# Patient Record
Sex: Male | Born: 1945 | Race: White | Hispanic: No | Smoking: Former smoker
Health system: Southern US, Community
[De-identification: ages and names within clinical notes are randomized; demographics above are authoritative.]

## PROBLEM LIST (undated history)

## (undated) DIAGNOSIS — K7460 Unspecified cirrhosis of liver: Secondary | ICD-10-CM

## (undated) DIAGNOSIS — I10 Essential (primary) hypertension: Secondary | ICD-10-CM

## (undated) DIAGNOSIS — C4490 Unspecified malignant neoplasm of skin, unspecified: Secondary | ICD-10-CM

## (undated) DIAGNOSIS — E119 Type 2 diabetes mellitus without complications: Secondary | ICD-10-CM

## (undated) DIAGNOSIS — I2510 Atherosclerotic heart disease of native coronary artery without angina pectoris: Secondary | ICD-10-CM

## (undated) DIAGNOSIS — Z5189 Encounter for other specified aftercare: Secondary | ICD-10-CM

## (undated) HISTORY — PX: CORONARY ARTERY BYPASS GRAFT: SHX141

## (undated) HISTORY — PX: SKIN CANCER EXCISION: SHX779

---

## 2011-02-17 DEATH — deceased

## 2015-09-13 ENCOUNTER — Inpatient Hospital Stay (HOSPITAL_COMMUNITY): Payer: Medicare Other | Admitting: Certified Registered Nurse Anesthetist

## 2015-09-13 ENCOUNTER — Emergency Department (HOSPITAL_BASED_OUTPATIENT_CLINIC_OR_DEPARTMENT_OTHER): Payer: Medicare Other

## 2015-09-13 ENCOUNTER — Encounter (HOSPITAL_COMMUNITY)
Admission: EM | Disposition: A | Discharge status: Skilled Nursing Facility | Payer: Self-pay | Source: Home / Self Care | Attending: Neurological Surgery

## 2015-09-13 ENCOUNTER — Inpatient Hospital Stay (HOSPITAL_BASED_OUTPATIENT_CLINIC_OR_DEPARTMENT_OTHER)
Admission: EM | Admit: 2015-09-13 | Discharge: 2015-09-28 | DRG: 025 | Disposition: A | Discharge status: Skilled Nursing Facility | Payer: Medicare Other | Attending: Neurological Surgery | Admitting: Neurological Surgery

## 2015-09-13 ENCOUNTER — Encounter (HOSPITAL_BASED_OUTPATIENT_CLINIC_OR_DEPARTMENT_OTHER): Payer: Self-pay

## 2015-09-13 DIAGNOSIS — D696 Thrombocytopenia, unspecified: Secondary | ICD-10-CM

## 2015-09-13 DIAGNOSIS — K760 Fatty (change of) liver, not elsewhere classified: Secondary | ICD-10-CM | POA: Diagnosis present

## 2015-09-13 DIAGNOSIS — G8191 Hemiplegia, unspecified affecting right dominant side: Secondary | ICD-10-CM | POA: Diagnosis not present

## 2015-09-13 DIAGNOSIS — Z01818 Encounter for other preprocedural examination: Secondary | ICD-10-CM | POA: Diagnosis not present

## 2015-09-13 DIAGNOSIS — I129 Hypertensive chronic kidney disease with stage 1 through stage 4 chronic kidney disease, or unspecified chronic kidney disease: Secondary | ICD-10-CM | POA: Diagnosis present

## 2015-09-13 DIAGNOSIS — S065XAA Traumatic subdural hemorrhage with loss of consciousness status unknown, initial encounter: Secondary | ICD-10-CM | POA: Diagnosis present

## 2015-09-13 DIAGNOSIS — D72819 Decreased white blood cell count, unspecified: Secondary | ICD-10-CM | POA: Insufficient documentation

## 2015-09-13 DIAGNOSIS — I6200 Nontraumatic subdural hemorrhage, unspecified: Secondary | ICD-10-CM | POA: Diagnosis not present

## 2015-09-13 DIAGNOSIS — R402362 Coma scale, best motor response, obeys commands, at arrival to emergency department: Secondary | ICD-10-CM | POA: Diagnosis present

## 2015-09-13 DIAGNOSIS — S065X9A Traumatic subdural hemorrhage with loss of consciousness of unspecified duration, initial encounter: Secondary | ICD-10-CM | POA: Diagnosis present

## 2015-09-13 DIAGNOSIS — E118 Type 2 diabetes mellitus with unspecified complications: Secondary | ICD-10-CM | POA: Diagnosis not present

## 2015-09-13 DIAGNOSIS — R402142 Coma scale, eyes open, spontaneous, at arrival to emergency department: Secondary | ICD-10-CM | POA: Diagnosis present

## 2015-09-13 DIAGNOSIS — R402252 Coma scale, best verbal response, oriented, at arrival to emergency department: Secondary | ICD-10-CM | POA: Diagnosis present

## 2015-09-13 DIAGNOSIS — Z9289 Personal history of other medical treatment: Secondary | ICD-10-CM

## 2015-09-13 DIAGNOSIS — D649 Anemia, unspecified: Secondary | ICD-10-CM | POA: Diagnosis present

## 2015-09-13 DIAGNOSIS — G9340 Encephalopathy, unspecified: Secondary | ICD-10-CM | POA: Diagnosis present

## 2015-09-13 DIAGNOSIS — I708 Atherosclerosis of other arteries: Secondary | ICD-10-CM | POA: Diagnosis present

## 2015-09-13 DIAGNOSIS — I6523 Occlusion and stenosis of bilateral carotid arteries: Secondary | ICD-10-CM | POA: Diagnosis present

## 2015-09-13 DIAGNOSIS — I6789 Other cerebrovascular disease: Secondary | ICD-10-CM | POA: Diagnosis not present

## 2015-09-13 DIAGNOSIS — I2510 Atherosclerotic heart disease of native coronary artery without angina pectoris: Secondary | ICD-10-CM | POA: Diagnosis present

## 2015-09-13 DIAGNOSIS — R161 Splenomegaly, not elsewhere classified: Secondary | ICD-10-CM | POA: Diagnosis present

## 2015-09-13 DIAGNOSIS — S069X9S Unspecified intracranial injury with loss of consciousness of unspecified duration, sequela: Secondary | ICD-10-CM

## 2015-09-13 DIAGNOSIS — G252 Other specified forms of tremor: Secondary | ICD-10-CM | POA: Diagnosis present

## 2015-09-13 DIAGNOSIS — R4701 Aphasia: Secondary | ICD-10-CM | POA: Diagnosis not present

## 2015-09-13 DIAGNOSIS — J9690 Respiratory failure, unspecified, unspecified whether with hypoxia or hypercapnia: Secondary | ICD-10-CM | POA: Insufficient documentation

## 2015-09-13 DIAGNOSIS — K7460 Unspecified cirrhosis of liver: Secondary | ICD-10-CM

## 2015-09-13 DIAGNOSIS — E1122 Type 2 diabetes mellitus with diabetic chronic kidney disease: Secondary | ICD-10-CM | POA: Diagnosis present

## 2015-09-13 DIAGNOSIS — R569 Unspecified convulsions: Secondary | ICD-10-CM | POA: Diagnosis not present

## 2015-09-13 DIAGNOSIS — G20 Parkinson's disease: Secondary | ICD-10-CM | POA: Insufficient documentation

## 2015-09-13 DIAGNOSIS — H02402 Unspecified ptosis of left eyelid: Secondary | ICD-10-CM | POA: Diagnosis not present

## 2015-09-13 DIAGNOSIS — Z87891 Personal history of nicotine dependence: Secondary | ICD-10-CM | POA: Diagnosis not present

## 2015-09-13 DIAGNOSIS — R482 Apraxia: Secondary | ICD-10-CM | POA: Diagnosis not present

## 2015-09-13 DIAGNOSIS — I10 Essential (primary) hypertension: Secondary | ICD-10-CM | POA: Insufficient documentation

## 2015-09-13 DIAGNOSIS — E875 Hyperkalemia: Secondary | ICD-10-CM | POA: Diagnosis not present

## 2015-09-13 DIAGNOSIS — D6959 Other secondary thrombocytopenia: Secondary | ICD-10-CM | POA: Diagnosis present

## 2015-09-13 DIAGNOSIS — N189 Chronic kidney disease, unspecified: Secondary | ICD-10-CM | POA: Diagnosis present

## 2015-09-13 DIAGNOSIS — Z7984 Long term (current) use of oral hypoglycemic drugs: Secondary | ICD-10-CM

## 2015-09-13 DIAGNOSIS — Z951 Presence of aortocoronary bypass graft: Secondary | ICD-10-CM | POA: Diagnosis not present

## 2015-09-13 DIAGNOSIS — I639 Cerebral infarction, unspecified: Secondary | ICD-10-CM

## 2015-09-13 DIAGNOSIS — R471 Dysarthria and anarthria: Secondary | ICD-10-CM | POA: Diagnosis not present

## 2015-09-13 DIAGNOSIS — E871 Hypo-osmolality and hyponatremia: Secondary | ICD-10-CM | POA: Diagnosis not present

## 2015-09-13 DIAGNOSIS — K7030 Alcoholic cirrhosis of liver without ascites: Secondary | ICD-10-CM | POA: Diagnosis not present

## 2015-09-13 DIAGNOSIS — G40901 Epilepsy, unspecified, not intractable, with status epilepticus: Secondary | ICD-10-CM | POA: Diagnosis not present

## 2015-09-13 DIAGNOSIS — R42 Dizziness and giddiness: Secondary | ICD-10-CM | POA: Diagnosis present

## 2015-09-13 DIAGNOSIS — D61818 Other pancytopenia: Secondary | ICD-10-CM | POA: Diagnosis present

## 2015-09-13 DIAGNOSIS — J9600 Acute respiratory failure, unspecified whether with hypoxia or hypercapnia: Secondary | ICD-10-CM | POA: Diagnosis not present

## 2015-09-13 DIAGNOSIS — G936 Cerebral edema: Secondary | ICD-10-CM | POA: Diagnosis not present

## 2015-09-13 DIAGNOSIS — S069XAA Unspecified intracranial injury with loss of consciousness status unknown, initial encounter: Secondary | ICD-10-CM | POA: Insufficient documentation

## 2015-09-13 DIAGNOSIS — Z4659 Encounter for fitting and adjustment of other gastrointestinal appliance and device: Secondary | ICD-10-CM

## 2015-09-13 DIAGNOSIS — S069X9A Unspecified intracranial injury with loss of consciousness of unspecified duration, initial encounter: Secondary | ICD-10-CM | POA: Insufficient documentation

## 2015-09-13 DIAGNOSIS — J9601 Acute respiratory failure with hypoxia: Secondary | ICD-10-CM | POA: Diagnosis not present

## 2015-09-13 HISTORY — DX: Atherosclerotic heart disease of native coronary artery without angina pectoris: I25.10

## 2015-09-13 HISTORY — DX: Encounter for other specified aftercare: Z51.89

## 2015-09-13 HISTORY — DX: Unspecified cirrhosis of liver: K74.60

## 2015-09-13 HISTORY — DX: Unspecified malignant neoplasm of skin, unspecified: C44.90

## 2015-09-13 HISTORY — DX: Essential (primary) hypertension: I10

## 2015-09-13 HISTORY — PX: CRANIOTOMY: SHX93

## 2015-09-13 HISTORY — DX: Type 2 diabetes mellitus without complications: E11.9

## 2015-09-13 LAB — BASIC METABOLIC PANEL
Anion gap: 6 (ref 5–15)
BUN: 24 mg/dL — AB (ref 6–20)
CO2: 25 mmol/L (ref 22–32)
CREATININE: 1.74 mg/dL — AB (ref 0.61–1.24)
Calcium: 9 mg/dL (ref 8.9–10.3)
Chloride: 106 mmol/L (ref 101–111)
GFR calc Af Amer: 44 mL/min — ABNORMAL LOW (ref >60)
GFR, EST NON AFRICAN AMERICAN: 38 mL/min — AB (ref >60)
GLUCOSE: 229 mg/dL — AB (ref 65–99)
POTASSIUM: 4.8 mmol/L (ref 3.5–5.1)
SODIUM: 137 mmol/L (ref 135–145)

## 2015-09-13 LAB — CBC WITH DIFFERENTIAL/PLATELET
Basophils Absolute: 0 10*3/uL (ref 0.0–0.1)
Basophils Relative: 1 %
EOS ABS: 0 10*3/uL (ref 0.0–0.7)
EOS PCT: 1 %
HCT: 36.5 % — ABNORMAL LOW (ref 39.0–52.0)
Hemoglobin: 12.3 g/dL — ABNORMAL LOW (ref 13.0–17.0)
LYMPHS ABS: 0.8 10*3/uL (ref 0.7–4.0)
LYMPHS PCT: 24 %
MCH: 33.4 pg (ref 26.0–34.0)
MCHC: 33.7 g/dL (ref 30.0–36.0)
MCV: 99.2 fL (ref 78.0–100.0)
MONO ABS: 0.3 10*3/uL (ref 0.1–1.0)
MONOS PCT: 8 %
Neutro Abs: 2.3 10*3/uL (ref 1.7–7.7)
Neutrophils Relative %: 67 %
PLATELETS: 37 10*3/uL — AB (ref 150–400)
RBC: 3.68 MIL/uL — AB (ref 4.22–5.81)
RDW: 11.9 % (ref 11.5–15.5)
WBC: 3.5 10*3/uL — ABNORMAL LOW (ref 4.0–10.5)

## 2015-09-13 LAB — MRSA PCR SCREENING: MRSA BY PCR: NEGATIVE

## 2015-09-13 LAB — PROTIME-INR
INR: 1.19 (ref 0.00–1.49)
PROTHROMBIN TIME: 15.3 s — AB (ref 11.6–15.2)

## 2015-09-13 LAB — CBG MONITORING, ED
Glucose-Capillary: 199 mg/dL — ABNORMAL HIGH (ref 65–99)
Glucose-Capillary: 190 mg/dL — ABNORMAL HIGH (ref 65–99)

## 2015-09-13 LAB — TROPONIN I: Troponin I: <0.03 ng/mL (ref <0.031)

## 2015-09-13 LAB — URINALYSIS, ROUTINE W REFLEX MICROSCOPIC
BILIRUBIN URINE: NEGATIVE
GLUCOSE, UA: 100 mg/dL — AB
HGB URINE DIPSTICK: NEGATIVE
Ketones, ur: NEGATIVE mg/dL
Leukocytes, UA: NEGATIVE
Nitrite: NEGATIVE
PROTEIN: NEGATIVE mg/dL
SPECIFIC GRAVITY, URINE: 1.018 (ref 1.005–1.030)
pH: 6 (ref 5.0–8.0)

## 2015-09-13 LAB — APTT: aPTT: 29 seconds (ref 24–37)

## 2015-09-13 SURGERY — CRANIOTOMY HEMATOMA EVACUATION SUBDURAL
Anesthesia: General | Site: Head | Laterality: Left

## 2015-09-13 MED ORDER — POTASSIUM CHLORIDE IN NACL 20-0.9 MEQ/L-% IV SOLN
INTRAVENOUS | Status: DC
Start: 2015-09-13 — End: 2015-09-18
  Administered 2015-09-13 – 2015-09-15 (×3): via INTRAVENOUS
  Administered 2015-09-15: 75 mL/h via INTRAVENOUS
  Administered 2015-09-16 – 2015-09-17 (×4): via INTRAVENOUS
  Filled 2015-09-13 (×12): qty 1000

## 2015-09-13 MED ORDER — FERROUS SULFATE 325 (65 FE) MG PO TABS
325.0000 mg | ORAL_TABLET | Freq: Every day | ORAL | Status: DC
  Administered 2015-09-14 – 2015-09-28 (×14): 325 mg via ORAL
  Filled 2015-09-13 (×14): qty 1

## 2015-09-13 MED ORDER — SODIUM CHLORIDE 0.9 % IV SOLN
500.0000 mg | Freq: Two times a day (BID) | INTRAVENOUS | Status: DC
  Administered 2015-09-13 – 2015-09-15 (×4): 500 mg via INTRAVENOUS
  Filled 2015-09-13 (×5): qty 5

## 2015-09-13 MED ORDER — DOCUSATE SODIUM 100 MG PO CAPS
100.0000 mg | ORAL_CAPSULE | Freq: Two times a day (BID) | ORAL | Status: DC
  Administered 2015-09-13 – 2015-09-17 (×7): 100 mg via ORAL
  Filled 2015-09-13 (×7): qty 1

## 2015-09-13 MED ORDER — THROMBIN 20000 UNITS EX SOLR
CUTANEOUS | Status: DC | PRN
  Administered 2015-09-13: 18:00:00 via TOPICAL

## 2015-09-13 MED ORDER — ONDANSETRON HCL 4 MG/2ML IJ SOLN
4.0000 mg | INTRAMUSCULAR | Status: DC | PRN
  Administered 2015-09-18: 4 mg via INTRAVENOUS

## 2015-09-13 MED ORDER — GLIPIZIDE 10 MG PO TABS
10.0000 mg | ORAL_TABLET | Freq: Two times a day (BID) | ORAL | Status: DC
  Administered 2015-09-14 – 2015-09-18 (×8): 10 mg via ORAL
  Filled 2015-09-13 (×10): qty 1

## 2015-09-13 MED ORDER — LABETALOL HCL 5 MG/ML IV SOLN
10.0000 mg | INTRAVENOUS | Status: DC | PRN
  Filled 2015-09-13: qty 4

## 2015-09-13 MED ORDER — EPHEDRINE SULFATE 50 MG/ML IJ SOLN
INTRAMUSCULAR | Status: DC | PRN
  Administered 2015-09-13: 10 mg via INTRAVENOUS

## 2015-09-13 MED ORDER — 0.9 % SODIUM CHLORIDE (POUR BTL) OPTIME
TOPICAL | Status: DC | PRN
  Administered 2015-09-13 (×3): 1000 mL

## 2015-09-13 MED ORDER — CEFAZOLIN SODIUM-DEXTROSE 2-3 GM-% IV SOLR
INTRAVENOUS | Status: DC | PRN
  Administered 2015-09-13: 2 g via INTRAVENOUS

## 2015-09-13 MED ORDER — PANTOPRAZOLE SODIUM 40 MG PO TBEC: 40.0000 mg | DELAYED_RELEASE_TABLET | Freq: Every day | ORAL | Status: DC

## 2015-09-13 MED ORDER — PROPOFOL 10 MG/ML IV BOLUS
INTRAVENOUS | Status: DC | PRN
  Administered 2015-09-13: 120 mg via INTRAVENOUS

## 2015-09-13 MED ORDER — THROMBIN 5000 UNITS EX SOLR
OROMUCOSAL | Status: DC | PRN
  Administered 2015-09-13: 18:00:00 via TOPICAL

## 2015-09-13 MED ORDER — LISINOPRIL 20 MG PO TABS
20.0000 mg | ORAL_TABLET | Freq: Every day | ORAL | Status: DC
  Administered 2015-09-14 – 2015-09-17 (×4): 20 mg via ORAL
  Filled 2015-09-13 (×4): qty 1

## 2015-09-13 MED ORDER — THIAMINE HCL 100 MG/ML IJ SOLN
Freq: Once | INTRAVENOUS | Status: AC
  Administered 2015-09-13: 19:00:00 via INTRAVENOUS
  Filled 2015-09-13: qty 1000

## 2015-09-13 MED ORDER — SODIUM CHLORIDE 0.9 % IR SOLN
Status: DC | PRN
  Administered 2015-09-13: 18:00:00

## 2015-09-13 MED ORDER — BACITRACIN ZINC 500 UNIT/GM EX OINT
TOPICAL_OINTMENT | CUTANEOUS | Status: DC | PRN
  Administered 2015-09-13: 1 via TOPICAL

## 2015-09-13 MED ORDER — HYDROCODONE-ACETAMINOPHEN 5-325 MG PO TABS: 1.0000 | ORAL_TABLET | ORAL | Status: DC | PRN

## 2015-09-13 MED ORDER — SODIUM CHLORIDE 0.9 % IV SOLN
INTRAVENOUS | Status: DC | PRN
  Administered 2015-09-13: 16:00:00 via INTRAVENOUS

## 2015-09-13 MED ORDER — ONDANSETRON HCL 4 MG/2ML IJ SOLN
INTRAMUSCULAR | Status: DC | PRN
  Administered 2015-09-13: 4 mg via INTRAVENOUS

## 2015-09-13 MED ORDER — SODIUM CHLORIDE 0.9 % IV SOLN
10.0000 mg | INTRAVENOUS | Status: DC | PRN
  Administered 2015-09-13: 10 ug/min via INTRAVENOUS

## 2015-09-13 MED ORDER — MIDAZOLAM HCL 5 MG/5ML IJ SOLN
INTRAMUSCULAR | Status: DC | PRN
  Administered 2015-09-13: 2 mg via INTRAVENOUS

## 2015-09-13 MED ORDER — ONDANSETRON HCL 4 MG PO TABS: 4.0000 mg | ORAL_TABLET | ORAL | Status: DC | PRN

## 2015-09-13 MED ORDER — LIDOCAINE HCL (CARDIAC) 20 MG/ML IV SOLN
INTRAVENOUS | Status: DC | PRN
  Administered 2015-09-13: 100 mg via INTRAVENOUS

## 2015-09-13 MED ORDER — ATENOLOL 25 MG PO TABS
25.0000 mg | ORAL_TABLET | Freq: Every day | ORAL | Status: DC
  Administered 2015-09-14 – 2015-09-28 (×14): 25 mg via ORAL
  Filled 2015-09-13 (×14): qty 1

## 2015-09-13 MED ORDER — ROCURONIUM BROMIDE 100 MG/10ML IV SOLN
INTRAVENOUS | Status: DC | PRN
  Administered 2015-09-13: 30 mg via INTRAVENOUS

## 2015-09-13 MED ORDER — ACETAMINOPHEN 650 MG RE SUPP: 650.0000 mg | RECTAL | Status: DC | PRN

## 2015-09-13 MED ORDER — PROMETHAZINE HCL 12.5 MG PO TABS
12.5000 mg | ORAL_TABLET | ORAL | Status: DC | PRN
  Filled 2015-09-13: qty 2

## 2015-09-13 MED ORDER — FENTANYL CITRATE (PF) 250 MCG/5ML IJ SOLN
INTRAMUSCULAR | Status: DC | PRN
  Administered 2015-09-13: 150 ug via INTRAVENOUS
  Administered 2015-09-13: 100 ug via INTRAVENOUS

## 2015-09-13 MED ORDER — CEFAZOLIN SODIUM-DEXTROSE 2-3 GM-% IV SOLR
2.0000 g | Freq: Three times a day (TID) | INTRAVENOUS | Status: AC
  Administered 2015-09-14 (×2): 2 g via INTRAVENOUS
  Filled 2015-09-13 (×3): qty 50

## 2015-09-13 MED ORDER — MORPHINE SULFATE (PF) 2 MG/ML IV SOLN: 1.0000 mg | INTRAVENOUS | Status: DC | PRN

## 2015-09-13 MED ORDER — SODIUM CHLORIDE 0.9 % IV BOLUS (SEPSIS): 500.0000 mL | Freq: Once | INTRAVENOUS | Status: DC

## 2015-09-13 MED ORDER — LIDOCAINE-EPINEPHRINE 1 %-1:100000 IJ SOLN
INTRAMUSCULAR | Status: DC | PRN
  Administered 2015-09-13: 10 mL

## 2015-09-13 MED ORDER — SUCCINYLCHOLINE CHLORIDE 20 MG/ML IJ SOLN
INTRAMUSCULAR | Status: DC | PRN
  Administered 2015-09-13: 140 mg via INTRAVENOUS

## 2015-09-13 MED ORDER — PANTOPRAZOLE SODIUM 40 MG IV SOLR
40.0000 mg | Freq: Every day | INTRAVENOUS | Status: DC
  Administered 2015-09-14 (×2): 40 mg via INTRAVENOUS
  Filled 2015-09-13 (×2): qty 40

## 2015-09-13 MED ORDER — GLYCOPYRROLATE 0.2 MG/ML IJ SOLN
INTRAMUSCULAR | Status: DC | PRN
  Administered 2015-09-13: 0.6 mg via INTRAVENOUS

## 2015-09-13 MED ORDER — NEOSTIGMINE METHYLSULFATE 10 MG/10ML IV SOLN
INTRAVENOUS | Status: DC | PRN
  Administered 2015-09-13: 5 mg via INTRAVENOUS

## 2015-09-13 MED ORDER — ACETAMINOPHEN 325 MG PO TABS: 650.0000 mg | ORAL_TABLET | ORAL | Status: DC | PRN

## 2015-09-13 SURGICAL SUPPLY — 55 items
BAG DECANTER FOR FLEXI CONT (MISCELLANEOUS) ×3 IMPLANT
BUR SPIRAL ROUTER 2.3 (BUR) ×2 IMPLANT
BUR SPIRAL ROUTER 2.3MM (BUR) ×1
CANISTER SUCT 3000ML PPV (MISCELLANEOUS) ×3 IMPLANT
CLIP TI MEDIUM 6 (CLIP) IMPLANT
DRAIN CHANNEL 10M FLAT 3/4 FLT (DRAIN) ×3 IMPLANT
DRAPE MICROSCOPE LEICA (MISCELLANEOUS) IMPLANT
DRAPE NEUROLOGICAL W/INCISE (DRAPES) ×3 IMPLANT
DRAPE SURG 17X23 STRL (DRAPES) IMPLANT
DRAPE WARM FLUID 44X44 (DRAPE) ×3 IMPLANT
DURAPREP 6ML APPLICATOR 50/CS (WOUND CARE) ×3 IMPLANT
ELECT CAUTERY BLADE 6.4 (BLADE) ×3 IMPLANT
ELECT REM PT RETURN 9FT ADLT (ELECTROSURGICAL) ×3
ELECTRODE REM PT RTRN 9FT ADLT (ELECTROSURGICAL) ×1 IMPLANT
EVACUATOR 1/8 PVC DRAIN (DRAIN) IMPLANT
EVACUATOR SILICONE 100CC (DRAIN) ×3 IMPLANT
GAUZE SPONGE 4X4 12PLY STRL (GAUZE/BANDAGES/DRESSINGS) ×3 IMPLANT
GAUZE SPONGE 4X4 16PLY XRAY LF (GAUZE/BANDAGES/DRESSINGS) IMPLANT
GLOVE BIO SURGEON STRL SZ8 (GLOVE) ×3 IMPLANT
GOWN STRL REUS W/ TWL LRG LVL3 (GOWN DISPOSABLE) IMPLANT
GOWN STRL REUS W/ TWL XL LVL3 (GOWN DISPOSABLE) IMPLANT
GOWN STRL REUS W/TWL 2XL LVL3 (GOWN DISPOSABLE) ×3 IMPLANT
GOWN STRL REUS W/TWL LRG LVL3 (GOWN DISPOSABLE)
GOWN STRL REUS W/TWL XL LVL3 (GOWN DISPOSABLE)
HEMOSTAT POWDER KIT SURGIFOAM (HEMOSTASIS) IMPLANT
KIT BASIN OR (CUSTOM PROCEDURE TRAY) ×3 IMPLANT
KIT ROOM TURNOVER OR (KITS) ×3 IMPLANT
NEEDLE HYPO 22GX1.5 SAFETY (NEEDLE) ×3 IMPLANT
NS IRRIG 1000ML POUR BTL (IV SOLUTION) ×3 IMPLANT
PACK CRANIOTOMY (CUSTOM PROCEDURE TRAY) ×3 IMPLANT
PAD ARMBOARD 7.5X6 YLW CONV (MISCELLANEOUS) ×3 IMPLANT
PATTIES SURGICAL .25X.25 (GAUZE/BANDAGES/DRESSINGS) IMPLANT
PATTIES SURGICAL .5 X.5 (GAUZE/BANDAGES/DRESSINGS) IMPLANT
PATTIES SURGICAL .5 X3 (DISPOSABLE) IMPLANT
PATTIES SURGICAL 1X1 (DISPOSABLE) IMPLANT
PERFORATOR LRG  14-11MM (BIT) ×2
PERFORATOR LRG 14-11MM (BIT) ×1 IMPLANT
PIN MAYFIELD SKULL DISP (PIN) IMPLANT
PLATE 1.5  2HOLE LNG NEURO (Plate) ×6 IMPLANT
PLATE 1.5 2HOLE LNG NEURO (Plate) ×3 IMPLANT
RUBBERBAND STERILE (MISCELLANEOUS) IMPLANT
SCREW SELF DRILL HT 1.5/4MM (Screw) ×18 IMPLANT
SPONGE NEURO XRAY DETECT 1X3 (DISPOSABLE) IMPLANT
SPONGE SURGIFOAM ABS GEL 100 (HEMOSTASIS) ×3 IMPLANT
STAPLER VISISTAT 35W (STAPLE) ×3 IMPLANT
SUT ETHILON 3 0 FSL (SUTURE) IMPLANT
SUT NURALON 4 0 TR CR/8 (SUTURE) ×6 IMPLANT
SUT VIC AB 2-0 CP2 18 (SUTURE) ×3 IMPLANT
SYR CONTROL 10ML LL (SYRINGE) ×3 IMPLANT
TAPE CLOTH SURG 4X10 WHT LF (GAUZE/BANDAGES/DRESSINGS) ×3 IMPLANT
TOWEL OR 17X24 6PK STRL BLUE (TOWEL DISPOSABLE) ×3 IMPLANT
TOWEL OR 17X26 10 PK STRL BLUE (TOWEL DISPOSABLE) ×3 IMPLANT
TRAY FOLEY W/METER SILVER 14FR (SET/KITS/TRAYS/PACK) IMPLANT
UNDERPAD 30X30 INCONTINENT (UNDERPADS AND DIAPERS) IMPLANT
WATER STERILE IRR 1000ML POUR (IV SOLUTION) ×3 IMPLANT

## 2015-09-13 NOTE — Anesthesia Postprocedure Evaluation (Signed)
Anesthesia Post Note  Patient: Benjamin Schroeder  Procedure(s) Performed: Procedure(s) (LRB): CRANIOTOMY HEMATOMA EVACUATION SUBDURAL (Left)  Patient location during evaluation: ICU Anesthesia Type: General Level of consciousness: awake and alert and patient cooperative Pain management: pain level controlled Vital Signs Assessment: post-procedure vital signs reviewed and stable Respiratory status: spontaneous breathing and respiratory function stable Cardiovascular status: stable Postop Assessment: No signs of nausea or vomiting Anesthetic complications: no    Last Vitals:  Filed Vitals:   09/13/15 1845 09/13/15 1900  BP: 134/62 134/62  Pulse: 68 67  Temp:    Resp: 11 10    Last Pain: There were no vitals filed for this visit.               Zain Bingman

## 2015-09-13 NOTE — ED Notes (Signed)
MD at bedside to discuss CT results. Pt now recalls he fell off a roof about a month ago and has been having "dizzy spells" since that time.

## 2015-09-13 NOTE — ED Notes (Signed)
Pt presents to ED with c/o dizziness, change in gait and confusion-pt reports feeling s/s over the past 2 weeks-male neighbor with pt states she brought pt in b/c he had a change in gait and confusion aprox 0930-she also states that pt's son reported pt with change in gait yesterday-pt A/O x 3-brought to tx room via w/c

## 2015-09-13 NOTE — H&P (Signed)
Subjective: Patient is a 69 y.o. male admitted for subdural hematoma. Onset of symptoms was several days ago, gradually worsening since that time.  The pain is rated mild, and is holocephalic. The pain is described as aching and occurs intermittently. The symptoms have been progressive and have gotten worse over the last 2 days. He notes some imbalance in his gait. He really has minimal headache. He describes a fall 3-4 weeks ago while doing gutters. No speech difficulty. He is right handed . symptoms are exacerbated by nothing in particular. 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   Past Medical History  Diagnosis Date  . Hypertension   . Diabetes mellitus without complication ([place])   . Blood transfusion without reported diagnosis   . Cirrhosis of liver ([place])   . Coronary artery disease   . Skin cancer     Past Surgical History  Procedure Laterality Date  . Coronary artery bypass graft    . Skin cancer excision      Prior to Admission medications   Medication Sig Start Date End Date Taking? Authorizing Provider  atenolol (TENORMIN) 50 MG tablet Take 25 mg by mouth daily.   Yes Historical Provider, MD  Cyanocobalamin (B-12 IJ) Inject as directed every 30 (thirty) days.   Yes Historical Provider, MD  cyclobenzaprine (FLEXERIL) 10 MG tablet Take 10 mg by mouth at bedtime.   Yes Historical Provider, MD  ferrous sulfate 325 (65 FE) MG tablet Take 325 mg by mouth daily with breakfast.   Yes Historical Provider, MD  glipiZIDE (GLUCOTROL) 10 MG tablet Take 10 mg by mouth 2 (two) times daily before a meal.   Yes Historical Provider, MD  lisinopril (PRINIVIL,ZESTRIL) 20 MG tablet Take 20 mg by mouth daily.   Yes Historical Provider, MD  loperamide (IMODIUM) 2 MG capsule Take by mouth as needed for diarrhea or loose stools.   Yes Historical Provider, MD  Magnesium 500 MG TABS  Take 1 tablet by mouth 2 (two) times daily.   Yes Historical Provider, MD  Omega-3 Fatty Acids (FISH OIL) 1200 MG CAPS Take by mouth 2 (two) times daily.   Yes Historical Provider, MD  pantoprazole (PROTONIX) 40 MG tablet Take 40 mg by mouth daily.   Yes Historical Provider, MD  simvastatin (ZOCOR) 40 MG tablet Take 40 mg by mouth daily.   Yes Historical Provider, MD   No Known Allergies  Social History  Substance Use Topics  . Smoking status: Former Research scientist (life sciences)  . Smokeless tobacco: Not on file  . Alcohol Use: Yes     Comment: occ    History reviewed. No pertinent family history.   Review of Systems  Positive ROS: Negative  All other systems have been reviewed and were otherwise negative with the exception of those mentioned in the HPI and as above.  Objective: Vital signs in last 24 hours: Temp:  [98 F (36.7 C)-98.5 F (36.9 C)] 98 F (36.7 C) (11/25 1545) Pulse Rate:  [57-84] 84 (11/25 1545) Resp:  [12-18] 15 (11/25 1545) BP: (116-138)/(49-60) 138/49 mmHg (11/25 1545) SpO2:  [99 %-100 %] 100 % (11/25 1545) Weight:  [83.8 kg (184 lb 11.9 oz)-84.369 kg (186 lb)] 83.8 kg (184 lb 11.9 oz) (11/25 1545)  General Appearance: Alert, cooperative, no distress, appears stated age Head: Normocephalic, without obvious abnormality, atraumatic Eyes: PERRL, conjunctiva/corneas clear, EOM's intact  Neck: Supple, symmetrical, trachea midline Back: Symmetric, no curvature, ROM normal, no CVA tenderness Lungs:  respirations unlabored Heart: Regular rate and rhythm Abdomen: Soft, non-tender Extremities: Extremities normal, atraumatic, no cyanosis or edema Pulses: 2+ and symmetric all extremities Skin: Skin color, texture, turgor normal, no rashes or lesions  NEUROLOGIC:   Mental status: Alert and oriented x4,  no aphasia, good attention span, fund of knowledge, and memory appear to be appropriate Motor Exam - grossly normal. No pronator drift Sensory Exam - grossly normal Reflexes:  1+ Coordination - grossly normal Gait - not tested Balance - grossly normal Cranial Nerves: I: smell Not tested  II: visual acuity  OS: nl    OD: nl  II: visual fields Full to confrontation  II: pupils Equal, round, reactive to light  III,VII: ptosis None  III,IV,VI: extraocular muscles  Full ROM  V: mastication Normal  V: facial light touch sensation  Normal  V,VII: corneal reflex  Present  VII: facial muscle function - upper  Normal  VII: facial muscle function - lower Normal  VIII: hearing Not tested  IX: soft palate elevation  Normal  IX,X: gag reflex Present  XI: trapezius strength  5/5  XI: sternocleidomastoid strength 5/5  XI: neck flexion strength  5/5  XII: tongue strength  Normal    Data Review Lab Results  Component Value Date   WBC 3.5* 09/13/2015   HGB 12.3* 09/13/2015   HCT 36.5* 09/13/2015   MCV 99.2 09/13/2015   PLT 37* 09/13/2015   Lab Results  Component Value Date   NA 137 09/13/2015   K 4.8 09/13/2015   CL 106 09/13/2015   CO2 25 09/13/2015   BUN 24* 09/13/2015   CREATININE 1.74* 09/13/2015   GLUCOSE 229* 09/13/2015   Lab Results  Component Value Date   INR 1.19 09/13/2015    Assessment/Plan: Patient admitted for 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 I have recommended a left craniotomy for evacuation of the subdural hematoma. I have described it as best I can. We have talked about typical risks which include but aren't limited to bleeding, infection, brain injury, stroke injury, loss of speech, numbness, weakness, just, and anesthesia risk including DVT pneumonia MI and death. He agrees to proceed.  I explained the condition and procedure to the patient and answered any questions.  Patient wishes to proceed with procedure as planned. Understands risks/ benefits and typical outcomes of procedure.   Pansie Guggisberg S 09/13/2015 4:02 PM

## 2015-09-13 NOTE — ED Notes (Signed)
[facility] here  For transport

## 2015-09-13 NOTE — ED Notes (Signed)
Pt's friend at bedside reports pt was very unsteady and leaning back and to the right while shopping just pta. Also reports when she dropped him off at door of ED to check in while she parked he seemed confused as where to go. Pt able to converse appropriately although is slow to respond to some questions. Pt reports he has been having similar episodes where he is off balance and feels dizzy/lightheaded for several weeks

## 2015-09-13 NOTE — Transfer of Care (Signed)
Immediate Anesthesia Transfer of Care Note  Patient: Benjamin Schroeder  Procedure(s) Performed: Procedure(s): CRANIOTOMY HEMATOMA EVACUATION SUBDURAL (Left)  Patient Location: NICU  Anesthesia Type:General  Level of Consciousness: awake, oriented, sedated, patient cooperative and responds to stimulation  Airway & Oxygen Therapy: Patient Spontanous Breathing and Patient connected to nasal cannula oxygen  Post-op Assessment: Report given to RN, Post -op Vital signs reviewed and stable, Patient moving all extremities and Patient moving all extremities X 4  Post vital signs: Reviewed and stable  Last Vitals:  Filed Vitals:   09/13/15 1412 09/13/15 1545  BP: 129/57 138/49  Pulse: 81 84  Temp:  36.7 C  Resp: 16 15    Complications: No apparent anesthesia complications

## 2015-09-13 NOTE — ED Notes (Signed)
MD at bedside. 

## 2015-09-13 NOTE — Op Note (Signed)
09/13/2015  6:13 PM  PATIENT:  Benjamin Schroeder  69 y.o. male  PRE-OPERATIVE DIAGNOSIS:  Left subdural hematoma  POST-OPERATIVE DIAGNOSIS:  Same  PROCEDURE:  Left craniotomy for evacuation of subdural hematoma  SURGEON:  Sherley Bounds, MD  ASSISTANTS: None  ANESTHESIA:   General  EBL: 100 ml  Total I/O In: -  Out: 300 [Urine:200; Blood:100]  BLOOD ADMINISTERED:none  DRAINS: 10 flat JP   SPECIMEN:  No Specimen  INDICATION FOR PROCEDURE: This patient presented to an outside hospital with changes in gait. CT scan showed a large left subdural hematoma. He had a history of trauma from a fall 2 or 3 weeks ago. I recommended craniotomy for evacuation. Patient understood the risks, benefits, and alternatives and potential outcomes and wished to proceed.  PROCEDURE DETAILS: The patient was taken to the operating room and after induction of adequate generalized endotracheal anesthesia, the head was affixed in a 3 point Mayfield head rest, and turned to the right to expose the left frontotemporal parietal region. The head was shaved and then cleaned and then prepped with DuraPrep and draped in the usual sterile fashion. 10 cc of local anesthetic was injected, and a Lanier incision was made on the left of the head. Raney clips were placed to establish hemostasis of the scalp, the muscle was reflected with the scalp flap, to expose the left frontoparietal temporal region. A burr hole was placed, and a craniotomy flap was turned utilizing the high-speed, air powered drill. The flap was then placed in bacitracin-containing saline solution, and the dura was opened to expose the left frontoparietal region. A hematoma was then removed with a combination of irrigation and suction. I continued to irrigate until the irrigant was clear to, and dried any bleeding with bipolar cautery. I then placed a subdural drain through separate stab incision and close the dura with a running 4-0 Nurolon suture. Dural tack up  sutures were placed. The dura was lined with Gelfoam, and the craniotomy flap was replaced with doggie-bone plates. The wound was copiously irrigated. A subgaleal drain was placed, and the galea was then closed with interrupted 2-0 Vicryl suture. The skin was then closed with staples a sterile dressing was applied. The patient was then taken out of the 3-point Mayfield headrest and awakened from general anesthesia, and transported to the recovery room in stable condition. At the end of the procedure all sponge, needle, and instrument counts were correct.   PLAN OF CARE: Admit to inpatient   PATIENT DISPOSITION:  PACU - hemodynamically stable.   Delay start of Pharmacological VTE agent (>24hrs) due to surgical blood loss or risk of bleeding:  yes

## 2015-09-13 NOTE — ED Notes (Signed)
Pt with difficulty sitting upright during orthostatic VS- leaning backwards- had to assist to sit upright- Pt able to stand without assist for VS

## 2015-09-13 NOTE — Anesthesia Procedure Notes (Signed)
Procedure Name: Intubation Date/Time: 09/13/2015 4:42 PM Performed by: Jacquiline Doe A Pre-anesthesia Checklist: Patient identified, Timeout performed, Emergency Drugs available, Suction available and Patient being monitored Patient Re-evaluated:Patient Re-evaluated prior to inductionOxygen Delivery Method: Circle system utilized Preoxygenation: Pre-oxygenation with 100% oxygen Intubation Type: IV induction, Rapid sequence and Cricoid Pressure applied Laryngoscope Size: Mac and 4 Grade View: Grade II Tube type: Subglottic suction tube Tube size: 8.0 mm Number of attempts: 1 Airway Equipment and Method: Stylet Placement Confirmation: ETT inserted through vocal cords under direct vision,  breath sounds checked- equal and bilateral and positive ETCO2 Secured at: 23 cm Tube secured with: Tape Dental Injury: Teeth and Oropharynx as per pre-operative assessment

## 2015-09-13 NOTE — ED Provider Notes (Signed)
CSN: [id number]     Arrival date & time 09/13/15  1153 History   First MD Initiated Contact with Patient 09/13/15 1203     Chief Complaint  Patient presents with  . Weakness     (Consider location/radiation/quality/duration/timing/severity/associated sxs/prior Treatment) Patient is a 69 y.o. male presenting with Acute Neurological Problem. The history is provided by the patient.  Cerebrovascular Accident This is a recurrent problem. The current episode started more than 1 week ago. The problem occurs every several days. The problem has not changed since onset.Pertinent negatives include no chest pain, no abdominal pain, no headaches and no shortness of breath. Nothing aggravates the symptoms. Nothing relieves the symptoms. He has tried nothing for the symptoms. The treatment provided no relief.    69 yo M with a chief complaints of vertigo. Is been going on for about a month. These happen suddenly and the patient has trouble walking. Occasionally last for about 30 minutes at a time. Patient denies head injury denies headaches denies chest pain shortness breath denies diaphoresis. Patient was shopping today when he noticed the symptoms recurring. This happened about 30 minutes ago. Feels like he is better currently. Asked if he could go home.   Past Medical History  Diagnosis Date  . Hypertension   . Diabetes mellitus without complication ([place])   . Blood transfusion without reported diagnosis   . Cirrhosis of liver ([place])   . Coronary artery disease   . Skin cancer    Past Surgical History  Procedure Laterality Date  . Coronary artery bypass graft    . Skin cancer excision     No family history on file. Social History  Substance Use Topics  . Smoking status: Former Research scientist (life sciences)  . Smokeless tobacco: None  . Alcohol Use: Yes     Comment: occ    Review of Systems  Constitutional: Negative for fever and chills.  HENT: Negative for congestion and facial swelling.   Eyes: Negative for  discharge and visual disturbance.  Respiratory: Negative for shortness of breath.   Cardiovascular: Negative for chest pain and palpitations.  Gastrointestinal: Negative for vomiting, abdominal pain and diarrhea.  Musculoskeletal: Negative for myalgias and arthralgias.  Skin: Negative for color change and rash.  Neurological: Positive for dizziness. Negative for tremors, syncope, speech difficulty, weakness and headaches.  Psychiatric/Behavioral: Negative for confusion and dysphoric mood.      Allergies  Review of patient's allergies indicates no known allergies.  Home Medications   Prior to Admission medications   Medication Sig Start Date End Date Taking? Authorizing Provider  atenolol (TENORMIN) 50 MG tablet Take 25 mg by mouth daily.   Yes Historical Provider, MD  Cyanocobalamin (B-12 IJ) Inject as directed every 30 (thirty) days.   Yes Historical Provider, MD  cyclobenzaprine (FLEXERIL) 10 MG tablet Take 10 mg by mouth at bedtime.   Yes Historical Provider, MD  ferrous sulfate 325 (65 FE) MG tablet Take 325 mg by mouth daily with breakfast.   Yes Historical Provider, MD  glipiZIDE (GLUCOTROL) 10 MG tablet Take 10 mg by mouth 2 (two) times daily before a meal.   Yes Historical Provider, MD  lisinopril (PRINIVIL,ZESTRIL) 20 MG tablet Take 20 mg by mouth daily.   Yes Historical Provider, MD  loperamide (IMODIUM) 2 MG capsule Take by mouth as needed for diarrhea or loose stools.   Yes Historical Provider, MD  Magnesium 500 MG TABS Take 1 tablet by mouth 2 (two) times daily.   Yes Historical Provider, MD  Omega-3 Fatty Acids (FISH OIL) 1200 MG CAPS Take by mouth 2 (two) times daily.   Yes Historical Provider, MD  pantoprazole (PROTONIX) 40 MG tablet Take 40 mg by mouth daily.   Yes Historical Provider, MD  simvastatin (ZOCOR) 40 MG tablet Take 40 mg by mouth daily.   Yes Historical Provider, MD   BP 116/57 mmHg  Temp(Src) 98.5 F (36.9 C) (Oral)  Resp 18  Ht 6' (1.829 m)  Wt 186  lb (84.369 kg)  BMI 25.22 kg/m2  SpO2 99% Physical Exam  Constitutional: He is oriented to person, place, and time. He appears well-developed and well-nourished.  HENT:  Head: Normocephalic and atraumatic.  Eyes: EOM are normal. Pupils are equal, round, and reactive to light.  Neck: Normal range of motion. Neck supple. No JVD present.  Cardiovascular: Normal rate and regular rhythm.  Exam reveals no gallop and no friction rub.   No murmur heard. Pulmonary/Chest: No respiratory distress. He has no wheezes.  Abdominal: He exhibits no distension. There is no tenderness. There is no rebound and no guarding.  Musculoskeletal: Normal range of motion.  Neurological: He is alert and oriented to person, place, and time. No cranial nerve deficit or sensory deficit. He displays a negative Romberg sign. Gait abnormal. Coordination normal. GCS eye subscore is 4. GCS verbal subscore is 5. GCS motor subscore is 6. He displays no Babinski's sign on the right side. He displays no Babinski's sign on the left side.  Reflex Scores:      Tricep reflexes are 2+ on the right side and 2+ on the left side.      Bicep reflexes are 2+ on the right side and 2+ on the left side.      Brachioradialis reflexes are 2+ on the right side and 2+ on the left side.      Patellar reflexes are 2+ on the right side and 2+ on the left side.      Achilles reflexes are 2+ on the right side and 2+ on the left side. While walking patient leans to the right. Patient is unable to sit straight up falls backwards. Otherwise neuro exam unremarkable.  Skin: No rash noted. No pallor.  Psychiatric: He has a normal mood and affect. His behavior is normal.  Nursing note and vitals reviewed.   ED Course  Procedures (including critical care time) Labs Review Labs Reviewed  BASIC METABOLIC PANEL - Abnormal; Notable for the following:    Glucose, Bld 229 (*)    BUN 24 (*)    Creatinine, Ser 1.74 (*)    GFR calc non Af Amer 38 (*)    GFR  calc Af Amer 44 (*)    All other components within normal limits  URINALYSIS, ROUTINE W REFLEX MICROSCOPIC (NOT AT [facility]) - Abnormal; Notable for the following:    Glucose, UA 100 (*)    All other components within normal limits  CBG MONITORING, ED - Abnormal; Notable for the following:    Glucose-Capillary 199 (*)    All other components within normal limits  CBC WITH DIFFERENTIAL/PLATELET  TROPONIN I  APTT  PROTIME-INR    Imaging Review Dg Chest 2 View  09/13/2015  CLINICAL DATA:  Weakness EXAM: CHEST  2 VIEW COMPARISON:  None. FINDINGS: Postop CABG. Heart size within normal limits. Negative for heart failure. Lungs are clear without infiltrate or effusion. IMPRESSION: No active cardiopulmonary disease. Electronically Signed   By: Franchot Gallo M.D.   On: 09/13/2015 12:45  Ct Head Wo Contrast  09/13/2015  CLINICAL DATA:  Intermittent dizziness EXAM: CT HEAD WITHOUT CONTRAST TECHNIQUE: Contiguous axial images were obtained from the base of the skull through the vertex without intravenous contrast. COMPARISON:  None. FINDINGS: There is a moderate to large left subdural hematoma which appears to be acute on chronic in etiology with acute blood products noted about its caudal aspect (representative image 18, series 2) with suspected older blood products about its cranial aspect (representative image 24, series 2). The subdural hematoma measures approximately 1.7 cm in diameter (17, series 2) and results in associated left cerebral sulcal effacement, mild mass effect upon the left lateral ventricle and approximately 6 mm of left to right midline shift (image 17, series 2). No evidence of hydrocephalus. No definite evidence of intraventricular, subarachnoid or intraparenchymal hemorrhage. Scattered periventricular hypodensities compatible microvascular ischemic disease. No definitive CT evidence of superimposed acute large territory infarct. No definite intraparenchymal or extra-axial mass.  Intracranial atherosclerosis. Circumferential mucosal thickening of the right maxillary sinus. The remaining paranasal sinuses and mastoid air cells are normally aerated. No air-fluid levels. Regional soft tissues appear normal. No displaced calvarial fracture. 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 Critical Value/emergent results were called by telephone at the time of interpretation on 09/13/2015 at 12:44 pm to Dr. Deno Etienne , who verbally acknowledged these results. Electronically Signed   By: Sandi Mariscal M.D.   On: 09/13/2015 12:48   I have personally reviewed and evaluated these images and lab results as part of my medical decision-making.   EKG Interpretation   Date/Time:  Friday September 13 2015 12:10:32 EST Ventricular Rate:  63 PR Interval:  280 QRS Duration: 140 QT Interval:  420 QTC Calculation: 429 R Axis:   121 Text Interpretation:   Suspect arm lead reversal, interpretation  assumes no reversal Sinus rhythm with 1st degree A-V block Right bundle  branch block Left posterior fascicular block  Bifascicular block   Cannot rule out Inferior infarct , age undetermined Abnormal ECG No old  tracing to compare Confirmed by Massey Ruhland MD, DANIEL [phone]) on 09/13/2015  12:15:44 PM      MDM   Final diagnoses:  SDH (subdural hematoma) ([place])    69 yo M with a chief complaint of dizziness. This been going on for about a month. Patient found to have a large left subdural hematoma with 66mm of midline shift. Upon further discussion with patient found that he had fallen off a roof about a month ago before the symptoms started. Denies blood thinner use. Discussed the case with Dr. Ronnald Ramp, neurosurgery will admit to the neuro ICU.  CRITICAL CARE Performed by: Cecilio Asper   Total critical care time: 40 minutes  Critical care time was exclusive of  separately billable procedures and treating other patients.  Critical care was necessary to treat or prevent imminent or life-threatening deterioration.  Critical care was time spent personally by me on the following activities: development of treatment plan with patient and/or surrogate as well as nursing, discussions with consultants, evaluation of patient's response to treatment, examination of patient, obtaining history from patient or surrogate, ordering and performing treatments and interventions, ordering and review of laboratory studies, ordering and review of radiographic studies, pulse oximetry and re-evaluation of patient's condition.  The patients results and plan were reviewed and discussed.   Any x-rays performed were independently reviewed by myself.  Differential diagnosis were considered with the presenting HPI.  Medications - No data to display  Filed Vitals:   09/13/15 1158  BP: 116/57  Temp: 98.5 F (36.9 C)  TempSrc: Oral  Resp: 18  Height: 6' (1.829 m)  Weight: 186 lb (84.369 kg)  SpO2: 99%    Final diagnoses:  SDH (subdural hematoma) ([facility])    Admission/ observation were discussed with the admitting physician, patient and/or family and they are comfortable with the plan.     Deno Etienne, DO 09/13/15 1330

## 2015-09-13 NOTE — Anesthesia Preprocedure Evaluation (Addendum)
Anesthesia Evaluation  Patient identified by MRN, date of birth, ID band Patient awake    Reviewed: Allergy & Precautions, NPO status , Patient's Chart, lab work & pertinent test results  History of Anesthesia Complications Negative for: history of anesthetic complications  Airway Mallampati: II  TM Distance: >3 FB Neck ROM: Full    Dental  (+) Teeth Intact, Dental Advisory Given   Pulmonary former smoker,    Pulmonary exam normal        Cardiovascular hypertension, + CABG  Normal cardiovascular exam     Neuro/Psych negative psych ROS   GI/Hepatic (+) Cirrhosis       ,   Endo/Other  diabetes  Renal/GU Renal InsufficiencyRenal disease     Musculoskeletal   Abdominal   Peds  Hematology   Anesthesia Other Findings   Reproductive/Obstetrics                            Anesthesia Physical Anesthesia Plan  ASA: III and emergent  Anesthesia Plan: General   Post-op Pain Management:    Induction: Intravenous, Rapid sequence and Cricoid pressure planned  Airway Management Planned: Oral ETT  Additional Equipment: Arterial line  Intra-op Plan:   Post-operative Plan: Possible Post-op intubation/ventilation  Informed Consent: I have reviewed the patients History and Physical, chart, labs and discussed the procedure including the risks, benefits and alternatives for the proposed anesthesia with the patient or authorized representative who has indicated his/her understanding and acceptance.   Dental advisory given  Plan Discussed with: CRNA, Anesthesiologist and Surgeon  Anesthesia Plan Comments:        Anesthesia Quick Evaluation

## 2015-09-13 NOTE — ED Notes (Signed)
Patient transported to CT 

## 2015-09-14 ENCOUNTER — Inpatient Hospital Stay (HOSPITAL_COMMUNITY): Payer: Medicare Other

## 2015-09-14 LAB — TYPE AND SCREEN
ABO/RH(D): O POS
Antibody Screen: NEGATIVE

## 2015-09-14 LAB — CBC WITH DIFFERENTIAL/PLATELET
Basophils Absolute: 0 10*3/uL (ref 0.0–0.1)
Basophils Relative: 1 %
EOS ABS: 0 10*3/uL (ref 0.0–0.7)
EOS PCT: 1 %
HCT: 34.7 % — ABNORMAL LOW (ref 39.0–52.0)
Hemoglobin: 11.9 g/dL — ABNORMAL LOW (ref 13.0–17.0)
LYMPHS ABS: 0.7 10*3/uL (ref 0.7–4.0)
Lymphocytes Relative: 18 %
MCH: 33.8 pg (ref 26.0–34.0)
MCHC: 34.3 g/dL (ref 30.0–36.0)
MCV: 98.6 fL (ref 78.0–100.0)
MONOS PCT: 6 %
Monocytes Absolute: 0.2 10*3/uL (ref 0.1–1.0)
Neutro Abs: 2.8 10*3/uL (ref 1.7–7.7)
Neutrophils Relative %: 75 %
PLATELETS: 34 10*3/uL — AB (ref 150–400)
RBC: 3.52 MIL/uL — ABNORMAL LOW (ref 4.22–5.81)
RDW: 12.5 % (ref 11.5–15.5)
WBC: 3.7 10*3/uL — ABNORMAL LOW (ref 4.0–10.5)

## 2015-09-14 LAB — ABO/RH: ABO/RH(D): O POS

## 2015-09-14 LAB — CBC
HCT: 34.2 % — ABNORMAL LOW (ref 39.0–52.0)
HEMOGLOBIN: 11.3 g/dL — AB (ref 13.0–17.0)
MCH: 32.7 pg (ref 26.0–34.0)
MCHC: 33 g/dL (ref 30.0–36.0)
MCV: 98.8 fL (ref 78.0–100.0)
Platelets: 35 10*3/uL — ABNORMAL LOW (ref 150–400)
RBC: 3.46 MIL/uL — ABNORMAL LOW (ref 4.22–5.81)
RDW: 12.4 % (ref 11.5–15.5)
WBC: 5.1 10*3/uL (ref 4.0–10.5)

## 2015-09-14 LAB — GLUCOSE, CAPILLARY: GLUCOSE-CAPILLARY: 143 mg/dL — AB (ref 65–99)

## 2015-09-14 MED ORDER — SODIUM CHLORIDE 0.9 % IV SOLN
Freq: Once | INTRAVENOUS | Status: AC
  Administered 2015-09-18: 13:00:00 via INTRAVENOUS

## 2015-09-14 MED ORDER — SODIUM CHLORIDE 0.9 % IV SOLN: Freq: Once | INTRAVENOUS | Status: DC

## 2015-09-14 NOTE — Progress Notes (Signed)
Patient ID: Benjamin Schroeder, male   DOB: 1946/08/30, 69 y.o.   MRN: [id number] I was called about the patient at 4:15 stating that he was a little less oriented that at his previous check. He would still awaken and follow commands. I ordered his head CT to be moved up. This looks good to me. There is some acute blood products and the drain is in place and there is some pneumocephalus but no increased shift or mass effect. I think it looks typical for early postoperative scans after craniotomy for subdural hematoma.  On exam he arouses easily in regards to me. He answers questions, but is no longer oriented to age or place or situation. He can state his name and he can name objects. He moves all extremities equally and has no drift. Pupils are equal and reactive. No facial asymmetry. There is more dysarthria and it is more difficult to understand all of his speech.  His CBC showed a platelet count of 37,000. He had preoperative petechiae so we wondered if he had low platelets. I have ordered a unit of platelets and a posttransfusion CBC.

## 2015-09-14 NOTE — Progress Notes (Signed)
Pt with acute mental status change with disorientation, slurred speech and twitching to L arm.  Pupils remain equal round and reactive, size 3 bilaterally.  I notified Dr. Ronnald Ramp at [phone], pt taken down for stat CT.  0630 Keppra dose given.

## 2015-09-15 LAB — GLUCOSE, CAPILLARY
GLUCOSE-CAPILLARY: 158 mg/dL — AB (ref 65–99)
GLUCOSE-CAPILLARY: 122 mg/dL — AB (ref 65–99)
Glucose-Capillary: 130 mg/dL — ABNORMAL HIGH (ref 65–99)

## 2015-09-15 LAB — CBC WITH DIFFERENTIAL/PLATELET
BASOS PCT: 0 %
Basophils Absolute: 0 10*3/uL (ref 0.0–0.1)
EOS ABS: 0 10*3/uL (ref 0.0–0.7)
EOS PCT: 1 %
HCT: 29.1 % — ABNORMAL LOW (ref 39.0–52.0)
HEMOGLOBIN: 10 g/dL — AB (ref 13.0–17.0)
Lymphocytes Relative: 17 %
Lymphs Abs: 0.6 10*3/uL — ABNORMAL LOW (ref 0.7–4.0)
MCH: 33.3 pg (ref 26.0–34.0)
MCHC: 34.4 g/dL (ref 30.0–36.0)
MCV: 97 fL (ref 78.0–100.0)
Monocytes Absolute: 0.3 10*3/uL (ref 0.1–1.0)
Monocytes Relative: 9 %
NEUTROS PCT: 74 %
Neutro Abs: 2.7 10*3/uL (ref 1.7–7.7)
PLATELETS: 30 10*3/uL — AB (ref 150–400)
RBC: 3 MIL/uL — AB (ref 4.22–5.81)
RDW: 12 % (ref 11.5–15.5)
WBC: 3.7 10*3/uL — AB (ref 4.0–10.5)

## 2015-09-15 LAB — CBC
HCT: 30.4 % — ABNORMAL LOW (ref 39.0–52.0)
HEMOGLOBIN: 10.5 g/dL — AB (ref 13.0–17.0)
MCH: 33.9 pg (ref 26.0–34.0)
MCHC: 34.5 g/dL (ref 30.0–36.0)
MCV: 98.1 fL (ref 78.0–100.0)
PLATELETS: 34 10*3/uL — AB (ref 150–400)
RBC: 3.1 MIL/uL — ABNORMAL LOW (ref 4.22–5.81)
RDW: 12.3 % (ref 11.5–15.5)
WBC: 4.1 10*3/uL (ref 4.0–10.5)

## 2015-09-15 LAB — PREPARE PLATELET PHERESIS
UNIT DIVISION: 0
Unit division: 0

## 2015-09-15 MED ORDER — LEVETIRACETAM 500 MG PO TABS
500.0000 mg | ORAL_TABLET | Freq: Two times a day (BID) | ORAL | Status: DC
  Administered 2015-09-15 – 2015-09-17 (×5): 500 mg via ORAL
  Filled 2015-09-15 (×5): qty 1

## 2015-09-15 MED ORDER — PANTOPRAZOLE SODIUM 40 MG PO TBEC
40.0000 mg | DELAYED_RELEASE_TABLET | Freq: Every day | ORAL | Status: DC
  Administered 2015-09-15: 40 mg via ORAL
  Filled 2015-09-15: qty 1

## 2015-09-15 NOTE — Progress Notes (Signed)
Utilization review completed.  

## 2015-09-15 NOTE — Progress Notes (Signed)
Subjective: Patient reports Doing better this morning minimal headache no numbness or tingling arms or legs  Objective: Vital signs in last 24 hours: Temp:  [98 F (36.7 C)-99.6 F (37.6 C)] 99.3 F (37.4 C) (11/27 0800) Pulse Rate:  [69-93] 79 (11/27 0700) Resp:  [14-23] 17 (11/27 0700) BP: (115-165)/(47-94) 154/61 mmHg (11/27 0700) SpO2:  [92 %-100 %] 96 % (11/27 0700)  Intake/Output from previous day: 11/26 0701 - 11/27 0700 In: 2610 [P.O.:150; I.V.:1970; Blood:385; IV Piggyback:105] Out: 2575 [Urine:2300; Drains:275] Intake/Output this shift:    Awake alert remains dysarthric but slightly improved neurologically nonfocal strength 5out of 5  Lab Results:  Recent Labs  09/14/15 1536 09/15/15 0235  WBC 5.1 4.1  HGB 11.3* 10.5*  HCT 34.2* 30.4*  PLT 35* 34*   BMET  Recent Labs  09/13/15 1255  NA 137  K 4.8  CL 106  CO2 25  GLUCOSE 229*  BUN 24*  CREATININE 1.74*  CALCIUM 9.0    Studies/Results: Dg Chest 2 View  09/13/2015  CLINICAL DATA:  Weakness EXAM: CHEST  2 VIEW COMPARISON:  None. FINDINGS: Postop CABG. Heart size within normal limits. Negative for heart failure. Lungs are clear without infiltrate or effusion. IMPRESSION: No active cardiopulmonary disease. Electronically Signed   By: Franchot Gallo M.D.   On: 09/13/2015 12:45   Ct Head Wo Contrast  09/14/2015  CLINICAL DATA:  Follow-up exam status post craniotomy. EXAM: CT HEAD WITHOUT CONTRAST TECHNIQUE: Contiguous axial images were obtained from the base of the skull through the vertex without intravenous contrast. COMPARISON:  Prior study from 09/13/2015. FINDINGS: Postoperative changes from interval left frontoparietal craniotomy seen for evacuation of a large left subdural hematoma. Skin staples overlie the craniotomy bone flap. A subdural drain extending via a burr hole is within the left subdural space. Previously seen mixed attenuation left subdural hematoma has been largely evacuated, although  some hyperdense blood products persist. Overall, the collection measures up to 18 mm. There is pneumocephalus within the extra-axial space. There persistent mass effect on the subjacent left cerebral hemisphere with sulcal effacement. Left-to-right shift is improved now measuring 4 mm, previously 6 mm. Partial effacement of the left lateral ventricle. No hydrocephalus. Mild basilar cistern crowding with trace left uncal herniation. No acute large vessel territory infarct.  No mass lesion. No acute abnormality about the orbits. Mucosal thickening within the right maxillary sinus. Paranasal sinuses are otherwise clear. No mastoid effusion. IMPRESSION: 1. Postoperative changes from interval left craniotomy for evacuation of large left subdural hematoma. Left subdural drain remains in place. No complication. 2. Decreased size of left subdural hematoma with improved left-to-right shift, now measuring 4 mm. Electronically Signed   By: Jeannine Boga M.D.   On: 09/14/2015 06:13   Ct Head Wo Contrast  09/13/2015  CLINICAL DATA:  Intermittent dizziness EXAM: CT HEAD WITHOUT CONTRAST TECHNIQUE: Contiguous axial images were obtained from the base of the skull through the vertex without intravenous contrast. COMPARISON:  None. FINDINGS: There is a moderate to large left subdural hematoma which appears to be acute on chronic in etiology with acute blood products noted about its caudal aspect (representative image 18, series 2) with suspected older blood products about its cranial aspect (representative image 24, series 2). The subdural hematoma measures approximately 1.7 cm in diameter (17, series 2) and results in associated left cerebral sulcal effacement, mild mass effect upon the left lateral ventricle and approximately 6 mm of left to right midline shift (image 17, series 2). No  evidence of hydrocephalus. No definite evidence of intraventricular, subarachnoid or intraparenchymal hemorrhage. Scattered  periventricular hypodensities compatible microvascular ischemic disease. No definitive CT evidence of superimposed acute large territory infarct. No definite intraparenchymal or extra-axial mass. Intracranial atherosclerosis. Circumferential mucosal thickening of the right maxillary sinus. The remaining paranasal sinuses and mastoid air cells are normally aerated. No air-fluid levels. Regional soft tissues appear normal. No displaced calvarial fracture. 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 Critical Value/emergent results were called by telephone at the time of interpretation on 09/13/2015 at 12:44 pm to Dr. Deno Etienne , who verbally acknowledged these results. Electronically Signed   By: Sandi Mariscal M.D.   On: 09/13/2015 12:48    Assessment/Plan: Continue observation the ICU physical and occupational therapy  LOS: 2 days     Benjamin Schroeder P 09/15/2015, 8:12 AM

## 2015-09-15 NOTE — Evaluation (Addendum)
Physical Therapy Evaluation Patient Details Name: Benjamin Schroeder MRN: [id number] DOB: [birth year] Today's Date: 09/15/2015   History of Present Illness  Pt is a 20 male who suffered a mechanical fall 3-4 weeks ago who came to ED with HA on 11/25. Pt found to have L SDH. Pt s/p L craniotomy for evactuation of subdural hematoma on 11/25.  Clinical Impression  Pt admitted with above. Pt presenting with both expressive and receptive aphasia, impaired motor planning/sequencing, R sided weakness, and impaired balance. Pt was living home alone and independent PTA. Pt would strongly benefit from CIR upon d/c to maximize functional return.     Follow Up Recommendations CIR (however found out pt is from [place], [place] from [facility] [address] [facility] [place] unit and they may look at facilities closer to home)    Equipment Recommendations   (TBD)    Recommendations for Other Services Rehab consult     Precautions / Restrictions Precautions Precautions: Fall Precaution Comments: Cranial JP drain Required Braces or Orthoses:  (has bilat hand mits) Restrictions Weight Bearing Restrictions: No      Mobility  Bed Mobility Overal bed mobility: Needs Assistance Bed Mobility: Supine to Sit     Supine to sit: Mod assist     General bed mobility comments: max directional verbal and tactile cues, assist for LE management off bed and trunk elevation  Transfers Overall transfer level: Needs assistance Equipment used: 2 person hand held assist Transfers: Sit to/from Stand Sit to Stand: Max assist;+2 physical assistance         General transfer comment: pt with poor motor planning requiring maxAx2 to initiate transfer then patient able to assist  Ambulation/Gait Ambulation/Gait assistance: Max assist;+2 physical assistance (3rd person for chair follow) Ambulation Distance (Feet): 25 Feet Assistive device: 2 person hand held assist Gait Pattern/deviations: Step-to pattern;Decreased step length -  right;Decreased stance time - right;Narrow base of support Gait velocity: slow Gait velocity interpretation: <1.8 ft/sec, indicative of risk for recurrent falls General Gait Details: pt requiring maxA to provide forward momentum to initiate ambulation, PT then provided modA to advance R LE due to weakness and inability to clear foot, however no R knee buckling  Stairs            Wheelchair Mobility    Modified Rankin (Stroke Patients Only) Modified Rankin (Stroke Patients Only) Pre-Morbid Rankin Score: No symptoms Modified Rankin: Moderately severe disability     Balance Overall balance assessment: Needs assistance Sitting-balance support: Bilateral upper extremity supported Sitting balance-Leahy Scale: Poor     Standing balance support: Single extremity supported Standing balance-Leahy Scale: Poor                               Pertinent Vitals/Pain Pain Assessment:  (didn't show any signs of discomfort)    Home Living Family/patient expects to be discharged to:: Inpatient rehab                 Additional Comments: per RN pt was living alone PTA.    Prior Function Level of Independence: Independent         Comments: per RN pt living alone and functioning indep     Hand Dominance        Extremity/Trunk Assessment   Upper Extremity Assessment: RUE deficits/detail RUE Deficits / Details: pt with minimal active/voluntary mvmt of R UE, unable to grip with R UE         Lower  Extremity Assessment: RLE deficits/detail RLE Deficits / Details: unable to test formally due to impaired cognition, noted generalized weakness and inability to clear foot during ambulation    Cervical / Trunk Assessment: Normal  Communication   Communication: Receptive difficulties;Expressive difficulties  Cognition Arousal/Alertness: Awake/alert (but sleepy, easily arousable) Behavior During Therapy: Flat affect Overall Cognitive Status: Impaired/Different from  baseline Area of Impairment: Orientation;Attention;Following commands;Safety/judgement;Problem solving;Awareness Orientation Level: Time;Situation Current Attention Level: Focused Memory: Decreased short-term memory Following Commands: Follows one step commands inconsistently;Follows one step commands with increased time Safety/Judgement: Decreased awareness of safety;Decreased awareness of deficits Awareness: Intellectual Problem Solving: Slow processing;Decreased initiation;Difficulty sequencing;Requires verbal cues;Requires tactile cues General Comments: pt aphasic, unable to sequence tasks, like transfers and amb    General Comments      Exercises        Assessment/Plan    PT Assessment Patient needs continued PT services  PT Diagnosis Difficulty walking;Generalized weakness;Hemiplegia dominant side   PT Problem List Decreased strength;Decreased activity tolerance;Decreased range of motion;Decreased balance;Decreased mobility;Decreased coordination;Decreased cognition;Decreased safety awareness  PT Treatment Interventions DME instruction;Gait training;Functional mobility training;Therapeutic activities;Therapeutic exercise;Cognitive remediation;Balance training;Neuromuscular re-education   PT Goals (Current goals can be found in the Care Plan section) Acute Rehab PT Goals Patient Stated Goal: didnt report PT Goal Formulation: Patient unable to participate in goal setting Time For Goal Achievement: 09/29/15 Potential to Achieve Goals: Good    Frequency Min 4X/week   Barriers to discharge Decreased caregiver support lives alone    Co-evaluation               End of Session Equipment Utilized During Treatment: Gait belt Activity Tolerance: Patient tolerated treatment well Patient left: in chair;with call bell/phone within reach;with restraints reapplied Nurse Communication: Mobility status         Time: [id number] PT Time Calculation (min) (ACUTE ONLY): 31  min   Charges:   PT Evaluation $Initial PT Evaluation Tier I: 1 Procedure PT Treatments $Gait Training: 8-22 mins   PT G CodesKingsley Schroeder 09/15/2015, 3:48 PM  Benjamin Schroeder, PT, DPT Pager #: [phone] Office #: [phone]

## 2015-09-15 NOTE — Progress Notes (Signed)
Rehab Admissions Coordinator Note:  Patient was screened by Cleatrice Burke for appropriateness for an Inpatient Acute Rehab Consult per PT recommendation.  At this time, we are recommending an inpt rehab consult.  Cleatrice Burke 09/15/2015, 8:27 PM  I can be reached at [phone].

## 2015-09-16 ENCOUNTER — Inpatient Hospital Stay (HOSPITAL_COMMUNITY): Payer: Medicare Other

## 2015-09-16 ENCOUNTER — Telehealth: Payer: Self-pay | Admitting: *Deleted

## 2015-09-16 ENCOUNTER — Encounter (HOSPITAL_COMMUNITY): Payer: Self-pay | Admitting: Neurological Surgery

## 2015-09-16 DIAGNOSIS — K7460 Unspecified cirrhosis of liver: Secondary | ICD-10-CM

## 2015-09-16 DIAGNOSIS — S069XAA Unspecified intracranial injury with loss of consciousness status unknown, initial encounter: Secondary | ICD-10-CM | POA: Insufficient documentation

## 2015-09-16 DIAGNOSIS — E118 Type 2 diabetes mellitus with unspecified complications: Secondary | ICD-10-CM | POA: Insufficient documentation

## 2015-09-16 DIAGNOSIS — R4701 Aphasia: Secondary | ICD-10-CM | POA: Insufficient documentation

## 2015-09-16 DIAGNOSIS — D696 Thrombocytopenia, unspecified: Secondary | ICD-10-CM | POA: Diagnosis present

## 2015-09-16 DIAGNOSIS — S069X9A Unspecified intracranial injury with loss of consciousness of unspecified duration, initial encounter: Secondary | ICD-10-CM | POA: Insufficient documentation

## 2015-09-16 DIAGNOSIS — I10 Essential (primary) hypertension: Secondary | ICD-10-CM | POA: Insufficient documentation

## 2015-09-16 DIAGNOSIS — I6200 Nontraumatic subdural hemorrhage, unspecified: Secondary | ICD-10-CM

## 2015-09-16 DIAGNOSIS — S069X9S Unspecified intracranial injury with loss of consciousness of unspecified duration, sequela: Secondary | ICD-10-CM

## 2015-09-16 LAB — GLUCOSE, CAPILLARY
GLUCOSE-CAPILLARY: 97 mg/dL (ref 65–99)
GLUCOSE-CAPILLARY: 180 mg/dL — AB (ref 65–99)
GLUCOSE-CAPILLARY: 112 mg/dL — AB (ref 65–99)
GLUCOSE-CAPILLARY: 87 mg/dL (ref 65–99)

## 2015-09-16 LAB — CBC WITH DIFFERENTIAL/PLATELET
BASOS ABS: 0 10*3/uL (ref 0.0–0.1)
Basophils Relative: 0 %
EOS ABS: 0 10*3/uL (ref 0.0–0.7)
EOS PCT: 1 %
HCT: 31 % — ABNORMAL LOW (ref 39.0–52.0)
Hemoglobin: 10.7 g/dL — ABNORMAL LOW (ref 13.0–17.0)
Lymphocytes Relative: 18 %
Lymphs Abs: 0.6 10*3/uL — ABNORMAL LOW (ref 0.7–4.0)
MCH: 33.2 pg (ref 26.0–34.0)
MCHC: 34.5 g/dL (ref 30.0–36.0)
MCV: 96.3 fL (ref 78.0–100.0)
Monocytes Absolute: 0.3 10*3/uL (ref 0.1–1.0)
Monocytes Relative: 9 %
Neutro Abs: 2.3 10*3/uL (ref 1.7–7.7)
Neutrophils Relative %: 72 %
PLATELETS: 47 10*3/uL — AB (ref 150–400)
RBC: 3.22 MIL/uL — AB (ref 4.22–5.81)
RDW: 12 % (ref 11.5–15.5)
WBC: 3.2 10*3/uL — AB (ref 4.0–10.5)

## 2015-09-16 LAB — COMPREHENSIVE METABOLIC PANEL
ALBUMIN: 2.8 g/dL — AB (ref 3.5–5.0)
ALT: 22 U/L (ref 17–63)
ANION GAP: 5 (ref 5–15)
AST: 28 U/L (ref 15–41)
Alkaline Phosphatase: 63 U/L (ref 38–126)
BUN: 16 mg/dL (ref 6–20)
CHLORIDE: 102 mmol/L (ref 101–111)
CO2: 23 mmol/L (ref 22–32)
Calcium: 8.4 mg/dL — ABNORMAL LOW (ref 8.9–10.3)
Creatinine, Ser: 1.18 mg/dL (ref 0.61–1.24)
GFR calc Af Amer: >60 mL/min (ref >60)
Glucose, Bld: 121 mg/dL — ABNORMAL HIGH (ref 65–99)
POTASSIUM: 4.3 mmol/L (ref 3.5–5.1)
Sodium: 130 mmol/L — ABNORMAL LOW (ref 135–145)
Total Bilirubin: 1.8 mg/dL — ABNORMAL HIGH (ref 0.3–1.2)
Total Protein: 6.1 g/dL — ABNORMAL LOW (ref 6.5–8.1)

## 2015-09-16 LAB — LACTATE DEHYDROGENASE: LDH: 132 U/L (ref 98–192)

## 2015-09-16 LAB — SAVE SMEAR

## 2015-09-16 LAB — PROTIME-INR
INR: 1.33 (ref 0.00–1.49)
PROTHROMBIN TIME: 16.6 s — AB (ref 11.6–15.2)

## 2015-09-16 LAB — FIBRINOGEN: FIBRINOGEN: 433 mg/dL (ref 204–475)

## 2015-09-16 MED ORDER — PHYTONADIONE 5 MG PO TABS
5.0000 mg | ORAL_TABLET | Freq: Every day | ORAL | Status: AC
  Administered 2015-09-16 – 2015-09-19 (×3): 5 mg via ORAL
  Filled 2015-09-16 (×4): qty 1

## 2015-09-16 MED ORDER — SODIUM CHLORIDE 0.9 % IV SOLN: Freq: Once | INTRAVENOUS | Status: DC

## 2015-09-16 MED ORDER — SODIUM CHLORIDE 0.9 % IV SOLN
2.0000 g | Freq: Four times a day (QID) | INTRAVENOUS | Status: AC
  Administered 2015-09-16 – 2015-09-20 (×16): 2 g via INTRAVENOUS
  Filled 2015-09-16 (×16): qty 8

## 2015-09-16 MED ORDER — INSULIN ASPART 100 UNIT/ML ~~LOC~~ SOLN
0.0000 [IU] | Freq: Three times a day (TID) | SUBCUTANEOUS | Status: DC
  Administered 2015-09-16: 3 [IU] via SUBCUTANEOUS

## 2015-09-16 MED ORDER — INSULIN ASPART 100 UNIT/ML ~~LOC~~ SOLN: 0.0000 [IU] | Freq: Every day | SUBCUTANEOUS | Status: DC

## 2015-09-16 MED ORDER — SODIUM CHLORIDE 0.9 % IV SOLN
4.0000 g | INTRAVENOUS | Status: AC
  Administered 2015-09-16: 4 g via INTRAVENOUS
  Filled 2015-09-16: qty 16

## 2015-09-16 NOTE — Progress Notes (Signed)
Physical Therapy Treatment Patient Details Name: Benjamin Schroeder MRN: [id number] DOB: 1945/11/30 Today's Date: 09/16/2015    History of Present Illness Pt is a 23 male who suffered a mechanical fall 3-4 weeks ago who came to ED with HA on 11/25. Pt found to have L SDH. Pt s/p L craniotomy for evactuation of subdural hematoma on 11/25.    PT Comments    Pt inconsistently follows gestural and tactile cueing for functional tasks.  Pt unable to perform familiar tasks with OT without hand over hand cueing for use of comb and applying lotion to his hands.  Pt leans posteriorly and with tactile cueing he is able to A with correcting balance.  Feel pt would benefit from CIR at D/C to maximize independence and decrease burden of care.  Will continue to follow.    Follow Up Recommendations  CIR     Equipment Recommendations  None recommended by PT    Recommendations for Other Services       Precautions / Restrictions Precautions Precautions: Fall Precaution Comments: Cranial JP drain Restrictions Weight Bearing Restrictions: No    Mobility  Bed Mobility Overal bed mobility: Needs Assistance;+2 for physical assistance Bed Mobility: Supine to Sit     Supine to sit: Mod assist;HOB elevated     General bed mobility comments: A for bil LEs and MinA at trunk to A with coming to sitting.  Gestural cues needed.    Transfers Overall transfer level: Needs assistance Equipment used: 2 person hand held assist Transfers: Sit to/from [facility] Sit to Stand: Mod assist;+2 physical assistance Stand pivot transfers: Max assist;+2 physical assistance       General transfer comment: pt with posterior lean and needs facilitation for anterior weight shift over BOS.  pt also needing facilitation to unweight R LE and advance R LE.    Ambulation/Gait                 Stairs            Wheelchair Mobility    Modified Rankin (Stroke Patients Only) Modified Rankin  (Stroke Patients Only) Pre-Morbid Rankin Score: No symptoms Modified Rankin: Severe disability     Balance Overall balance assessment: Needs assistance Sitting-balance support: No upper extremity supported;Feet supported Sitting balance-Leahy Scale: Fair Sitting balance - Comments: pt leans posteriorly, but is able to self-correct with verbal and tactile cueing.  pt clearly fatigues and needs increased A the longer he is sitting.   Postural control: Posterior lean Standing balance support: During functional activity Standing balance-Leahy Scale: Poor                      Cognition Arousal/Alertness: Awake/alert Behavior During Therapy: Flat affect Overall Cognitive Status: Impaired/Different from baseline Area of Impairment: Following commands;Problem solving       Following Commands: Follows one step commands inconsistently;Follows one step commands with increased time     Problem Solving: Requires verbal cues;Requires tactile cues General Comments: Difficult to truly assess cognition as pt globally aphasic, but pt does follow some gestural directions, but not consistently.  pt also has difficulty using familiar objects.      Exercises      General Comments        Pertinent Vitals/Pain Pain Assessment: Faces Faces Pain Scale: No hurt    Home Living                      Prior Function  PT Goals (current goals can now be found in the care plan section) Acute Rehab PT Goals Patient Stated Goal: pt unable to state. PT Goal Formulation: Patient unable to participate in goal setting Time For Goal Achievement: 09/29/15 Potential to Achieve Goals: Good Progress towards PT goals: Progressing toward goals    Frequency  Min 4X/week    PT Plan Current plan remains appropriate    Co-evaluation PT/OT/SLP Co-Evaluation/Treatment: Yes Reason for Co-Treatment: Necessary to address cognition/behavior during functional activity;For  patient/therapist safety PT goals addressed during session: Mobility/safety with mobility;Balance       End of Session Equipment Utilized During Treatment: Gait belt Activity Tolerance: Patient tolerated treatment well Patient left: in chair;with call bell/phone within reach;with restraints reapplied     Time: 1100-1138 PT Time Calculation (min) (ACUTE ONLY): 38 min  Charges:  $Therapeutic Activity: 8-22 mins                    G CodesCatarina Hartshorn, [place] 09/16/2015, 12:20 PM

## 2015-09-16 NOTE — Telephone Encounter (Signed)
Triage Pager reads "hematology consult requested by Dr. Sherley Bounds [phone]) for this patient currently admitted to [facility], Neuro ICU.  Called [facility] To notify today's on-call provider.

## 2015-09-16 NOTE — Consult Note (Signed)
Referring MD: Dr. Sherley Bounds  PCP:  No PCP Per Patient Hematologist: Dr. 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 Per history given to me on the phone by his hematologist, he was first evaluated for thrombocytopenia in January 2011. He had a normal bone marrow biopsy.   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 He was found to have splenomegaly. A fine-needle aspirate of the spleen was normal.   A transjugular liver biopsy was diagnostic for cirrhosis done at [facility] in [place]. He was subsequently referred to the [facility] for follow-up. His platelet counts have run in the 30-40,000 range at least since January 2011. He last saw his hematologist one year ago in June 2015. Platelet count was 38,000 at that time. His wife is deceased. He has 2 sons. He likes to come to [place] occasionally to go shopping. He has had episodes of recurrent vertigo. He had an acute episode while shopping on the day of admission November 25. He was brought to the hospital for further evaluation. 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 No hydrocephalus. Initial platelet count 37,000. He was given a platelet transfusion and taken for immediate surgical drainage. Follow-up CT scan done on November 26 showed decreased size of the hematoma with decrease in left-to-right shift now 4 mm. He has been receiving daily platelet transfusions. A follow-up CT scan done at 8 AM this  morning shows some progression of the hematoma. Of note, his platelet count has not increased with the transfusions. Count today is 30,000. He has a surgical drain in place. Drainage has been steadily decreasing since surgery: 575 mL's November 26, 275 on the 27th, to 16 on the 28th, 95 mL so far today.    Past Medical History  Diagnosis Date  . Hypertension   . Diabetes mellitus without complication ([place])   . Blood transfusion without reported diagnosis   . Cirrhosis of liver ([place])   . Coronary artery disease   . Skin cancer   :  Past Surgical History  Procedure Laterality Date  . Coronary artery bypass graft    . Skin cancer excision    . Craniotomy Left 09/13/2015    Procedure: CRANIOTOMY HEMATOMA EVACUATION SUBDURAL;  Surgeon: Eustace Moore, MD;  Location: [facility];  Service: Neurosurgery;  Laterality: Left;  :  . sodium chloride   Intravenous Once  . sodium chloride   Intravenous Once  . sodium chloride   Intravenous Once  . aminocaproic acid (AMICAR) IVPB  4 g Intravenous NOW   Followed by  . aminocaproic acid (AMICAR) IVPB  2 g Intravenous Q6H  . atenolol  25 mg Oral Daily  . docusate sodium  100 mg Oral BID  . ferrous sulfate  325 mg Oral Q breakfast  . glipiZIDE  10 mg Oral BID AC  . insulin aspart  0-15 Units Subcutaneous TID WC  . insulin aspart  0-5 Units Subcutaneous QHS  . levETIRAcetam  500 mg Oral BID  .  lisinopril  20 mg Oral Daily  :  No Known Allergies:  History reviewed. No pertinent family history.:  Social History   Social History  . Marital Status: Widowed    Spouse Name: N/A  . Number of Children: N/A  . Years of Education: N/A   Occupational History  . Not on file.   Social History Main Topics  . Smoking status: Former Research scientist (life sciences)  . Smokeless tobacco: Not on file  . Alcohol Use: Yes     Comment: occ  . Drug Use: No  . Sexual Activity: Not on file   Other Topics Concern  . Not on file   Social History Narrative  :  ROS:  Unobtainable from the patient   Vitals: Filed Vitals:   09/16/15 1300 09/16/15 1400  BP: 141/53 135/47  Pulse: 69 65  Temp:    Resp: 17 18    PHYSICAL EXAM: General appearance: He is awake and alert but has a dense expressive and receptive aphasia and cannot follow or understand commands HEENT: Pharynx no erythema or exudate. Neck full range of motion. Lymph Nodes: No cervical supraclavicular or axillary adenopathy Resp: Lungs overall clear but poor respiratory effort. Resonant to percussion. Cardio: Regular rhythm no murmur or gallop Vascular: Carotids 2+ no bruits Breasts: GI: Abdomen soft, nontender, I was unable to palpate an enlarged spleen but this has been documented by other examiners in the past GU: Condom catheter draining clear yellow urine Extremities: No edema, tenderness Neurologic: Surgical drain left posterior parietal region draining serosanguineous fluid. Left ptosis. Left pupil 1-2 millimeters smaller than right. Both round and reactive. Mild facial asymmetry with left facial droop. Right-sided neglect. When I lifted his right arm he had no tone and let the arm fall down to the bed. Reflexes 1+ at the biceps and at the knees symmetric Skin: No rash or ecchymosis.  Labs:   Recent Labs  09/15/15 0235 09/15/15 2200  WBC 4.1 3.7*  HGB 10.5* 10.0*  HCT 30.4* 29.1*  PLT 34* 30*   No results for input(s): NA, K, CL, CO2, GLUCOSE, BUN, CREATININE, CALCIUM in the last 72 hours.  Blood smear review: Pending  Images Studies/Results:  Ct Head Wo Contrast  09/16/2015  CLINICAL DATA:  Subdural hematoma.  Craniotomy. EXAM: CT HEAD WITHOUT CONTRAST TECHNIQUE: Contiguous axial images were obtained from the base of the skull through the vertex without intravenous contrast. COMPARISON:  CT head without contrast 09/14/2015 and 09/13/2015. FINDINGS: A left frontal craniotomy is again noted. An extracranial drainage catheter remains in place. The hyperdense extracranial  collection is slightly larger than on the prior study. Mass effect has slightly increased as well with increased effacement of the left lateral ventricle and slight increase in midline shift, now measuring 5.5 mm at the level of the foramen of Monro. No discrete new hemorrhage is present. No acute cortical infarct is evident. Circumferential mucosal thickening is again noted in the right maxillary sinus. The remaining paranasal sinuses are clear. The mastoid air cells are clear. Atherosclerotic calcifications are present at the dural margin of the vertebral arteries and within the cavernous internal carotid arteries. IMPRESSION: 1. Slight increase in the extent of extra-axial high-density fluid/hemorrhage. 2. Slight increased mass effect with midline shift now measuring 5.5 mm. Electronically Signed   By: San Morelle M.D.   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 No significant response to platelet transfusions. Some extension of hematoma approximately 48 hours after initial drainage.  Recommendation: We don't have many options in platelet refractory patients with cirrhosis. One can still see some immediate hemostatic effect even if the platelet count itself doesn't rise on laboratory testing. Occasionally additional responses are seen with HLA typed platelets but delay in obtaining this product usually exceeds the minimal benefit. Immunotherapy such as IVIG or steroids are not effective in this situation. He may benefit  from antifibrinolytic agents. Recombinant factor VII a can achieve immediate hemostasis at the expense of increased  arterial thrombotic events and would be relatively contraindicated in this man with coronary artery disease, hypertension, diabetes, and chronic renal insufficiency.  I'm going to start him on Amicar 4 g loading dose then 2 g IV every 6 hours. Reevaluate for continued therapy every 24 hours. If he becomes more cognizant and we can change to oral Amicar tablets. Additional platelet transfusions can be attempted if there are signs of accelerated bleeding. In an emergency situation, I would still use recombinant VII a at a lower dose of 40 mcg/kg IV bolus and not the higher doses used in the clinical trials that were associated with increased thrombotic events. I'm going to start him on oral vitamin K to maximize synthesis of clotting factors in his liver. His prothrombin time is just over the upper limit of normal at 15.3 seconds so there does not seem to be a significant component of liver related coagulopathy.   Murriel Hopper, MD, [place]  Hematology-Oncology/Internal Medicine [phone]  09/16/2015, 3:25 PM

## 2015-09-16 NOTE — Progress Notes (Signed)
Rehab admissions - Please see rehab consult done by Dr. Posey Pronto today.  I will follow up with patient and family tomorrow for rehab plans.  Call me for questions.  RC:[phone]

## 2015-09-16 NOTE — Consult Note (Signed)
Physical Medicine and Rehabilitation Consult   Reason for Consult: TBI with right sided weakness, apraxia, global aphasia Referring Physician: Dr. Ronnald Ramp    HPI: Benjamin Schroeder is a 69 y.o. RH-male with history of HTN, DM type 2, cirrhosis of liver who was brought to the ED by a neighbor on  09/10/15 with problems walking and confusion. History taken from chart review. Patient reported fall 3-4 weeks PTA while doing gutters and two week history of dizziness and 1 month with problems with balance and minimal HA.  CT head revealed large acute on chronic left SDH with mass effect on lateral ventricle and 6 mm Left to right shift.  Patient noted to be thrombocytopenic with platelets 37 and was transfused with platelets. He underwent Left crani for evacuation of SDH and was started on keppra later that day due to MS changes with motor seizures LUE. PT evaluation done yesterday and patient limited by right sided weakness with apraxia and balance deficits.  ST evaluation reveals expressive and receptive aphasia with impaired motor planing and sequencing.  CIR recommended for follow up therapy.   Review of Systems  Unable to perform ROS: medical condition     Past Medical History  Diagnosis Date  . Hypertension   . Diabetes mellitus without complication ([place])   . Blood transfusion without reported diagnosis   . Cirrhosis of liver ([place])   . Coronary artery disease   . Skin cancer     Past Surgical History  Procedure Laterality Date  . Coronary artery bypass graft    . Skin cancer excision      History reviewed. No pertinent family history.    Social History:  Lives alone in [place], [place]. Per reports that he has quit smoking.  He does not have any smokeless tobacco history on file. Per reports that he drinks alcohol. He reports that he does not use illicit drugs.    Allergies: No Known Allergies    Medications Prior to Admission  Medication Sig Dispense Refill  . atenolol  (TENORMIN) 50 MG tablet Take 25 mg by mouth daily.    . Cyanocobalamin (B-12 IJ) Inject as directed every 30 (thirty) days.    . cyclobenzaprine (FLEXERIL) 10 MG tablet Take 10 mg by mouth at bedtime.    . ferrous sulfate 325 (65 FE) MG tablet Take 325 mg by mouth daily with breakfast.    . glipiZIDE (GLUCOTROL) 10 MG tablet Take 10 mg by mouth 2 (two) times daily before a meal.    . lisinopril (PRINIVIL,ZESTRIL) 20 MG tablet Take 20 mg by mouth daily.    Marland Kitchen loperamide (IMODIUM) 2 MG capsule Take by mouth as needed for diarrhea or loose stools.    . Magnesium 500 MG TABS Take 1 tablet by mouth 2 (two) times daily.    . Omega-3 Fatty Acids (FISH OIL) 1200 MG CAPS Take 1 capsule by mouth 2 (two) times daily.     . pantoprazole (PROTONIX) 40 MG tablet Take 40 mg by mouth daily.    . simvastatin (ZOCOR) 40 MG tablet Take 40 mg by mouth daily.      Home: Home Living Family/patient expects to be discharged to:: Inpatient rehab Additional Comments: per RN pt was living alone PTA.  Functional History: Prior Function Level of Independence: Independent Comments: per RN pt living alone and functioning indep Functional Status:  Mobility: Bed Mobility Overal bed mobility: Needs Assistance Bed Mobility: Supine to Sit Supine to sit: Mod assist  General bed mobility comments: max directional verbal and tactile cues, assist for LE management off bed and trunk elevation Transfers Overall transfer level: Needs assistance Equipment used: 2 person hand held assist Transfers: Sit to/from Stand Sit to Stand: Max assist, +2 physical assistance General transfer comment: pt with poor motor planning requiring maxAx2 to initiate transfer then patient able to assist Ambulation/Gait Ambulation/Gait assistance: Max assist, +2 physical assistance (3rd person for chair follow) Ambulation Distance (Feet): 25 Feet Assistive device: 2 person hand held assist Gait Pattern/deviations: Step-to pattern, Decreased step  length - right, Decreased stance time - right, Narrow base of support General Gait Details: pt requiring maxA to provide forward momentum to initiate ambulation, PT then provided modA to advance R LE due to weakness and inability to clear foot, however no R knee buckling Gait velocity: slow Gait velocity interpretation: <1.8 ft/sec, indicative of risk for recurrent falls    ADL:    Cognition: Cognition Overall Cognitive Status: Impaired/Different from baseline Orientation Level: Oriented to person, Disoriented to place, Disoriented to time, Disoriented to situation Cognition Arousal/Alertness: Awake/alert (but sleepy, easily arousable) Behavior During Therapy: Flat affect Overall Cognitive Status: Impaired/Different from baseline Area of Impairment: Orientation, Attention, Following commands, Safety/judgement, Problem solving, Awareness Orientation Level: Time, Situation Current Attention Level: Focused Memory: Decreased short-term memory Following Commands: Follows one step commands inconsistently, Follows one step commands with increased time Safety/Judgement: Decreased awareness of safety, Decreased awareness of deficits Awareness: Intellectual Problem Solving: Slow processing, Decreased initiation, Difficulty sequencing, Requires verbal cues, Requires tactile cues General Comments: pt aphasic, unable to sequence tasks, like transfers and amb  Blood pressure 143/55, pulse 75, temperature 97.5 F (36.4 C), temperature source Oral, resp. rate 16, height 6' (1.829 m), weight 83.8 kg (184 lb 11.9 oz), SpO2 94 %. Physical Exam  Nursing note and vitals reviewed. Constitutional: He appears well-developed and well-nourished.  Globally aphasic. Posey and bilateral mittens in place.   HENT:  Head: Normocephalic.  Left crani incision with dry dressing.  Eyes: Conjunctivae and EOM are normal. Pupils are equal, round, and reactive to light.  Neck: Normal range of motion. Neck supple.    Cardiovascular: Normal rate and regular rhythm.   Respiratory: Effort normal and breath sounds normal. No respiratory distress. He has no wheezes.  GI: Soft. Bowel sounds are normal. He exhibits no distension. There is no tenderness.  Musculoskeletal: He exhibits no edema or tenderness.  Neurological: He is alert.  ?right inattention.  Globally aphasia Unable to assess sensation, motor strength or CNs ?Clonus, however, pt resisting Mitts in place - Hoffman's not tested 3+ DTRs throughout  Skin: Skin is warm and dry. No rash noted.  +JP  Psychiatric: Cognition and memory are impaired. He expresses impulsivity and inappropriate judgment. He is noncommunicative.    Results for orders placed or performed during the hospital encounter of 09/13/15 (from the past 24 hour(s))  Glucose, capillary     Status: Abnormal   Collection Time: 09/15/15 12:24 PM  Result Value Ref Range   Glucose-Capillary 158 (H) 65 - 99 mg/dL  Glucose, capillary     Status: Abnormal   Collection Time: 09/15/15  6:04 PM  Result Value Ref Range   Glucose-Capillary 130 (H) 65 - 99 mg/dL  CBC with Differential/Platelet     Status: Abnormal   Collection Time: 09/15/15 10:00 PM  Result Value Ref Range   WBC 3.7 (L) 4.0 - 10.5 K/uL   RBC 3.00 (L) 4.22 - 5.81 MIL/uL   Hemoglobin 10.0 (L)  13.0 - 17.0 g/dL   HCT 29.1 (L) 39.0 - 52.0 %   MCV 97.0 78.0 - 100.0 fL   MCH 33.3 26.0 - 34.0 pg   MCHC 34.4 30.0 - 36.0 g/dL   RDW 12.0 11.5 - 15.5 %   Platelets 30 (L) 150 - 400 K/uL   Neutrophils Relative % 74 %   Neutro Abs 2.7 1.7 - 7.7 K/uL   Lymphocytes Relative 17 %   Lymphs Abs 0.6 (L) 0.7 - 4.0 K/uL   Monocytes Relative 9 %   Monocytes Absolute 0.3 0.1 - 1.0 K/uL   Eosinophils Relative 1 %   Eosinophils Absolute 0.0 0.0 - 0.7 K/uL   Basophils Relative 0 %   Basophils Absolute 0.0 0.0 - 0.1 K/uL  Glucose, capillary     Status: Abnormal   Collection Time: 09/15/15 10:18 PM  Result Value Ref Range    Glucose-Capillary 122 (H) 65 - 99 mg/dL  Prepare Pheresed Platelets     Status: None (Preliminary result)   Collection Time: 09/16/15  6:33 AM  Result Value Ref Range   Unit Number [id number]    Blood Component Type PLTPHER LR1    Unit division 00    Status of Unit ISSUED    Transfusion Status OK TO TRANSFUSE   Glucose, capillary     Status: None   Collection Time: 09/16/15  8:08 AM  Result Value Ref Range   Glucose-Capillary 97 65 - 99 mg/dL   Comment 1 Notify RN    Comment 2 Document in Chart    Ct Head Wo Contrast  09/16/2015  CLINICAL DATA:  Subdural hematoma.  Craniotomy. EXAM: CT HEAD WITHOUT CONTRAST TECHNIQUE: Contiguous axial images were obtained from the base of the skull through the vertex without intravenous contrast. COMPARISON:  CT head without contrast 09/14/2015 and 09/13/2015. FINDINGS: A left frontal craniotomy is again noted. An extracranial drainage catheter remains in place. The hyperdense extracranial collection is slightly larger than on the prior study. Mass effect has slightly increased as well with increased effacement of the left lateral ventricle and slight increase in midline shift, now measuring 5.5 mm at the level of the foramen of Monro. No discrete new hemorrhage is present. No acute cortical infarct is evident. Circumferential mucosal thickening is again noted in the right maxillary sinus. The remaining paranasal sinuses are clear. The mastoid air cells are clear. Atherosclerotic calcifications are present at the dural margin of the vertebral arteries and within the cavernous internal carotid arteries. IMPRESSION: 1. Slight increase in the extent of extra-axial high-density fluid/hemorrhage. 2. Slight increased mass effect with midline shift now measuring 5.5 mm. Electronically Signed   By: San Morelle M.D.   On: 09/16/2015 08:13    Assessment/Plan: Diagnosis: TBI with right sided weakness, apraxia, global aphasia Labs and images independently  reviewed.  Records reviewed and summated above.  [place] score:  IV  Speech to evaluate for Post traumatic amnesia and interval GOAT scores to  assess progress.  NeuroPsych evaluation for behavorial assessment.  Provide environmental management by reducing the level of stimulation,  tolerating restlessness when possible, protecting patient from harming self  or others and reducing patient's cognitive confusion.  Address behavioral concerns include providing structured environments and  daily routines.  Cognitive therapy to direct modular abilities in order to maintain goals  including problem solving, self regulation/monitoring, self management,  attention, and memory.  Fall precautions; pt at risk for second impact syndrome  Prevention of secondary injury: monitor  for hypotension, hypoxia, seizures or  signs of increased ICP  Prophylactic AED  PT/OT consults for mobility strengthening, endurance training and adaptive  ADLs   Consider pharmacological intervention if necessary with neurostimulants,  Such   as amantadine, methylphenidate, modafinil, etc.  Consider Propranolol for agitation and storming  Avoid medications that could impair cognitive abilities, such as  anticholinergics, antihistaminic, benzodiazapines, narcotics, etc when possible  1. Does the need for close, 24 hr/day medical supervision in concert with the patient's rehab needs make it unreasonable for this patient to be served in a less intensive setting? Yes 2. Co-Morbidities requiring supervision/potential complications: HTN (monitor and provide prns in accordance with increased physical exertion and pain), DM type 2 (Monitor in accordance with exercise and adjust meds as necessary), cirrhosis of liver (avoid hepatotoxic meds), thrombocytopenia (< 60,000/mm3 no resistive exercise, < 20,000/mm3 AROM, maybe walking) 3. Due to bladder management, bowel management, safety, skin/wound care, disease management, medication  administration, pain management and patient education, does the patient require 24 hr/day rehab nursing? Yes 4. Does the patient require coordinated care of a physician, rehab nurse, PT (1-2 hrs/day, 5 days/week), OT (1-2 hrs/day, 5 days/week) and SLP (1-2 hrs/day, 5 days/week) to address physical and functional deficits in the context of the above medical diagnosis(es)? Yes Addressing deficits in the following areas: balance, endurance, locomotion, strength, transferring, bowel/bladder control, bathing, dressing, feeding, grooming, toileting, cognition, speech, language, swallowing and psychosocial support 5. Can the patient actively participate in an intensive therapy program of at least 3 hrs of therapy per day at least 5 days per week? Potentially 6. The potential for patient to make measurable gains while on inpatient rehab is good 7. Anticipated functional outcomes upon discharge from inpatient rehab are min assist and mod assist  with PT, min assist and mod assist with OT, min assist and mod assist with SLP. 8. Estimated rehab length of stay to reach the above functional goals is: 20-24 days. 9. Does the patient have adequate social supports and living environment to accommodate these discharge functional goals? Potentially 10. Anticipated D/C setting: Other 11. Anticipated post D/C treatments: Other 12. Overall Rehab/Functional Prognosis: fair  RECOMMENDATIONS: This patient's condition is appropriate for continued rehabilitative care in the following setting: Pt with increasing cerebral edema and midline shift with persistent thrombocytopenia.  Per report, pt lives alone.  Given discharge disposition, would consider admission to rehab to decrease burden of care once pt's acute medical issues have stablized.   Patient has agreed to participate in recommended program. Potentially Note that insurance prior authorization may be required for reimbursement for recommended care.  Comment: Rehab  Admissions Coordinator to follow up.  Delice Lesch, MD 09/16/2015

## 2015-09-16 NOTE — Progress Notes (Signed)
OT Cancellation Note  Patient Details Name: Alante Jasin MRN: [id number] DOB: [birth year]   Cancelled Treatment:    Reason Eval/Treat Not Completed: Eval completed, full eval to follow.  Agree with recommendation for CIR.  Darlina Rumpf Harlan, OTR/L [id number]  09/16/2015, 12:01 PM

## 2015-09-16 NOTE — Progress Notes (Signed)
Patient ID: Benjamin Schroeder, male   DOB: 02/04/1946, 69 y.o.   MRN: [id number] Subjective: Patient is globally aphasic today and seems mildly R hemiparetic.  Objective: Vital signs in last 24 hours: Temp:  [98 F (36.7 C)-99.3 F (37.4 C)] 98 F (36.7 C) (11/28 0700) Pulse Rate:  [62-73] 65 (11/28 0700) Resp:  [14-22] 20 (11/28 0700) BP: (129-171)/(49-99) 137/52 mmHg (11/28 0700) SpO2:  [94 %-100 %] 96 % (11/28 0700)  Intake/Output from previous day: 11/27 0701 - 11/28 0700 In: 2430 [P.O.:480; I.V.:1920; Blood:30] Out: 2121 [Urine:1905; Drains:216] Intake/Output this shift:    Patient is awake and alert and regards the examiner, he can hold both extremities but has some drift in the right arm, he is globally a phasic  Lab Results: Lab Results  Component Value Date   WBC 3.7* 09/15/2015   HGB 10.0* 09/15/2015   HCT 29.1* 09/15/2015   MCV 97.0 09/15/2015   PLT 30* 09/15/2015   Lab Results  Component Value Date   INR 1.19 09/13/2015   BMET Lab Results  Component Value Date   NA 137 09/13/2015   K 4.8 09/13/2015   CL 106 09/13/2015   CO2 25 09/13/2015   GLUCOSE 229* 09/13/2015   BUN 24* 09/13/2015   CREATININE 1.74* 09/13/2015   CALCIUM 9.0 09/13/2015    Studies/Results: No results found.  Assessment/Plan: Check CT scan of head (I suspect this is simple cortical irritation), hematology consult for thrombocytopenia, platelets going in now, continue supportive care   LOS: 3 days    Benjamin Schroeder S 09/16/2015, 7:24 AM

## 2015-09-16 NOTE — Evaluation (Signed)
Speech Language Pathology Evaluation Patient Details Name: Benjamin Schroeder MRN: [id number] DOB: 05-09-1946 Today's Date: 09/16/2015 Time: [id number] SLP Time Calculation (min) (ACUTE ONLY): 12 min  Problem List:  Patient Active Problem List   Diagnosis Date Noted  . SDH (subdural hematoma) ([place]) 09/13/2015  . Subdural hematoma ([place]) 09/13/2015   Past Medical History:  Past Medical History  Diagnosis Date  . Hypertension   . Diabetes mellitus without complication ([place])   . Blood transfusion without reported diagnosis   . Cirrhosis of liver ([place])   . Coronary artery disease   . Skin cancer    Past Surgical History:  Past Surgical History  Procedure Laterality Date  . Coronary artery bypass graft    . Skin cancer excision     HPI:  Pt is a 4 male who suffered a mechanical fall 3-4 weeks ago who came to ED with HA on 11/25. Pt found to have L SDH. Pt s/p L craniotomy for evactuation of subdural hematoma on 11/25.   Assessment / Plan / Recommendation Clinical Impression  Cognitive-linguistic evaluation complete. Patient presents with severe global aphasia impacting both expressive and receptive language, improving slightly in the context of a functional task. Patient will benefit from SLP f/u in the acute care setting as well as intense post acute f/u.     SLP Assessment  Patient needs continued Speech Lanaguage Pathology Services    Follow Up Recommendations  Inpatient Rehab    Frequency and Duration min 3x week  2 weeks      SLP Evaluation Prior Functioning  Cognitive/Linguistic Baseline: Information not available   Cognition  Overall Cognitive Status: Impaired/Different from baseline Arousal/Alertness: Awake/alert Orientation Level: Oriented to person;Disoriented to place;Disoriented to time;Disoriented to situation Attention: Sustained Sustained Attention: Impaired Sustained Attention Impairment: Verbal basic;Functional basic Awareness: Impaired Awareness  Impairment: Intellectual impairment Problem Solving: Impaired Problem Solving Impairment: Functional basic Safety/Judgment: Impaired Comments: bilateral mittens in place    Comprehension  Auditory Comprehension Overall Auditory Comprehension: Impaired Yes/No Questions: Impaired Basic Biographical Questions: 0-25% accurate Commands: Impaired One Step Basic Commands: 0-24% accurate EffectiveTechniques: Visual/Gestural cues;Other (Comment) (contextual cues) Visual Recognition/Discrimination Discrimination: Exceptions to [facility] Common Objects: Unable to indentify Reading Comprehension Reading Status: Impaired Word level: Impaired    Expression Expression Primary Mode of Expression: Verbal Verbal Expression Overall Verbal Expression: Impaired Initiation: Impaired Automatic Speech: Social Response Level of Generative/Spontaneous Verbalization: Word Repetition: Impaired Level of Impairment: Word level Naming: Impairment Responsive: 0-25% accurate Confrontation: Impaired Common Objects: Unable to indentify Convergent: 0-24% accurate Divergent: 0-24% accurate   Oral / Motor Oral Motor/Sensory Function Overall Oral Motor/Sensory Function: Mild impairment Facial ROM: Within Functional Limits Facial Symmetry: Abnormal symmetry right;Suspected CN VII (facial) dysfunction (mild) Lingual Symmetry: Within Functional Limits Mandible: Within Functional Limits Motor Speech Overall Motor Speech:  (TBD as verbal output improves) Respiration: Within functional limits Phonation: Normal Resonance: Within functional limits   Gabriel Rainwater MA, CCC-SLP [phone]  [name] 09/16/2015, 9:54 AM

## 2015-09-16 NOTE — Evaluation (Signed)
Clinical/Bedside Swallow Evaluation Patient Details  Name: Benjamin Schroeder MRN: [id number] Date of Birth: 05/30/1946  Today's Date: 09/16/2015 Time: SLP Start Time (ACUTE ONLY): [id number] SLP Stop Time (ACUTE ONLY): 0945 SLP Time Calculation (min) (ACUTE ONLY): 9 min  Past Medical History:  Past Medical History  Diagnosis Date  . Hypertension   . Diabetes mellitus without complication ([place])   . Blood transfusion without reported diagnosis   . Cirrhosis of liver ([place])   . Coronary artery disease   . Skin cancer    Past Surgical History:  Past Surgical History  Procedure Laterality Date  . Coronary artery bypass graft    . Skin cancer excision     HPI:  Pt is a 7 male who suffered a mechanical fall 3-4 weeks ago who came to ED with HA on 11/25. Pt found to have L SDH. Pt s/p L craniotomy for evactuation of subdural hematoma on 11/25.   Assessment / Plan / Recommendation Clinical Impression  Bedside swallow evaluation complete. Patient presents with a cognitively based oral dysphagia characterized by prolonged oral transit of bolus boluses with moderate left sided buccal pocketing. Recommend dysphagia 1 solids (puree), thin liquids to facilitate maximal and safe po intake.     Aspiration Risk  Mild aspiration risk    Diet Recommendation   Dysphagia 1 (puree) Thin liquids Small bites and sips Lingual sweep to clear buccal pocketing  Medication Administration: Crushed with puree    Other  Recommendations Oral Care Recommendations: Oral care BID   Follow up Recommendations  Inpatient Rehab    Frequency and Duration min 3x week  2 weeks       Prognosis Prognosis for Safe Diet Advancement: Good Barriers to Reach Goals: Cognitive deficits;Language deficits      Swallow Study   General HPI: Pt is a 33 male who suffered a mechanical fall 3-4 weeks ago who came to ED with HA on 11/25. Pt found to have L SDH. Pt s/p L craniotomy for evactuation of subdural hematoma on  11/25. Type of Study: Bedside Swallow Evaluation Previous Swallow Assessment: none Diet Prior to this Study: Regular;Thin liquids Temperature Spikes Noted: No Respiratory Status: Room air History of Recent Intubation:  (for surgery only) Behavior/Cognition: Alert;Cooperative;Pleasant mood (aphasic) Oral Cavity Assessment: Within Functional Limits Oral Care Completed by SLP: No Vision: Functional for self-feeding Self-Feeding Abilities: Able to feed self;Needs assist (due to cognitive and language deficits) Patient Positioning: Upright in bed Baseline Vocal Quality: Normal Volitional Cough: Cognitively unable to elicit Volitional Swallow: Unable to elicit    Oral/Motor/Sensory Function Overall Oral Motor/Sensory Function: Mild impairment Facial ROM: Within Functional Limits Facial Symmetry: Abnormal symmetry right;Suspected CN VII (facial) dysfunction (mild) Lingual Symmetry: Within Functional Limits Mandible: Within Functional Limits   Ice Chips Ice chips: Not tested   Thin Liquid Thin Liquid: Within functional limits Presentation: Cup;Self Fed;Straw    Nectar Thick Nectar Thick Liquid: Not tested   Honey Thick Honey Thick Liquid: Not tested   Puree Puree: Impaired Presentation: Spoon Oral Phase Impairments: Other (comment) (delayed oral transit) Oral Phase Functional Implications: Prolonged oral transit   Solid Solid: Impaired Presentation: Spoon Oral Phase Impairments: Impaired mastication Oral Phase Functional Implications: Prolonged oral transit;Oral residue;Oral holding;Left lateral sulci pocketing      Benjamin Fagerstrom MA, CCC-SLP [phone]  Benjamin Schroeder 09/16/2015,10:12 AM

## 2015-09-16 NOTE — Evaluation (Signed)
Occupational Therapy Evaluation Patient Details Name: Benjamin Schroeder MRN: [id number] DOB: August 01, 1946 Today's Date: 09/16/2015    History of Present Illness Pt is a 28 male who suffered a mechanical fall 3-4 weeks ago who came to ED with HA on 11/25. Pt found to have L SDH. Pt s/p L craniotomy for evactuation of subdural hematoma on 11/25.   Clinical Impression   Pt admitted with above. He demonstrates the below listed deficits and will benefit from continued OT to maximize safety and independence with BADLs.  Pt is able to perform simple grooming with max - total A.  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 He also presents with Rt hemiparesis, Rt inattention, and impaired balance.  He requires total A for remainder of ADLs and mod - max A +2 for functional mobility. Recommend CIR.       Follow Up Recommendations  CIR;Supervision/Assistance - 24 hour    Equipment Recommendations  Other (comment) (TBD)    Recommendations for Other Services Rehab consult     Precautions / Restrictions Precautions Precautions: Fall Precaution Comments: Cranial JP drain      Mobility Bed Mobility Overal bed mobility: Needs Assistance;+2 for physical assistance Bed Mobility: Supine to Sit     Supine to sit: Mod assist;HOB elevated     General bed mobility comments: A for bil LEs and MinA at trunk to A with coming to sitting.  Gestural cues needed.    Transfers Overall transfer level: Needs assistance Equipment used: 2 person hand held assist   Sit to Stand: Mod assist;+2 physical assistance Stand pivot transfers: Max assist;+2 physical assistance       General transfer comment: pt with posterior lean and needs facilitation for anterior weight shift over BOS.  pt also needing facilitation to unweight R LE and advance R LE.      Balance Overall balance assessment: Needs  assistance Sitting-balance support: Feet supported;No upper extremity supported Sitting balance-Leahy Scale: Fair Sitting balance - Comments: pt leans posteriorly, but is able to self-correct with verbal and tactile cueing.  pt clearly fatigues and needs increased A the longer he is sitting.   Postural control: Posterior lean Standing balance support: During functional activity Standing balance-Leahy Scale: Poor                              ADL Overall ADL's : Needs assistance/impaired Eating/Feeding: Moderate assistance;Sitting   Grooming: Wash/dry hands;Wash/dry face;Brushing hair;Maximal assistance;Sitting Grooming Details (indicate cue type and reason): Pt will spontaneously wipe face.  He is unable to demonstrate use of comb, and when hand over hand assist attempted, pt resisted movement.  When lotion applied to Rt hand, and assist provided to initiate activity, pt did rub lotion into hands with min A  Upper Body Bathing: Total assistance;Sitting   Lower Body Bathing: Total assistance;Sit to/from stand   Upper Body Dressing : Total assistance;Sitting   Lower Body Dressing: Total assistance;Sit to/from stand   Toilet Transfer: Stand-pivot;BSC;Maximal Print production planner Details (indicate cue type and reason): Pt requires assist for balance and to advance Rt LE  Toileting- Clothing Manipulation and Hygiene: Total assistance;Sit to/from stand       Functional mobility during ADLs: Moderate assistance;Maximal assistance;+2 for physical assistance       Vision Additional Comments: Pt unable to provide visual history and unable to participate in formal visual assessment.  He does not track  items presented in either the rt or Lt fields.  he will spontaneously loot to Lt, but not to the Rt.    Perception Perception Perception Tested?: Yes Perception Deficits: Inattention/neglect Inattention/Neglect: Does not attend to right visual field;Does not attend to right  side of body Spatial deficits: decreased attention to the Rt body.  Pt resists crossing midline with either extremity.      Praxis Praxis Praxis tested?: Deficits Deficits: Initiation;Ideation;Ideomotor Praxis-Other Comments: Pt does not demonstrate use of comb and when it is placed in his hand, he does not attempt to use it. When hand over hand assist provided, pt resists activity     Pertinent Vitals/Pain Pain Assessment: Faces Faces Pain Scale: No hurt     Hand Dominance  (unable to determine)   Extremity/Trunk Assessment Upper Extremity Assessment Upper Extremity Assessment: RUE deficits/detail RUE Deficits / Details: Pt appears to have increased flexor spasticity, however, he will spontaneously move rt UE including isolated finger movements when rubbing lotion onto hands    Lower Extremity Assessment Lower Extremity Assessment: Defer to PT evaluation       Communication Communication Communication: Receptive difficulties;Expressive difficulties   Cognition Arousal/Alertness: Awake/alert Behavior During Therapy: Flat affect Overall Cognitive Status: Difficult to assess         Following Commands: Follows one step commands inconsistently;Follows one step commands with increased time       General Comments: Pt with significant communication deficits as well as potential ideomotor and ideational apraxias which make it very difficult to accurately assess cognition   General Comments       Exercises       Shoulder Instructions      Home Living Family/patient expects to be discharged to:: Inpatient rehab                                 Additional Comments: Per RN pt lived alone      Prior Functioning/Environment Level of Independence: Independent        Comments: per RN pt living alone and functioning indep    OT Diagnosis: Generalized weakness;Cognitive deficits;Disturbance of vision;Hemiplegia dominant side;Apraxia   OT Problem List:  Decreased strength;Decreased range of motion;Decreased activity tolerance;Impaired balance (sitting and/or standing);Impaired vision/perception;Decreased coordination;Decreased cognition;Decreased safety awareness;Decreased knowledge of use of DME or AE;Decreased knowledge of precautions;Impaired sensation;Impaired tone;Impaired UE functional use   OT Treatment/Interventions: Self-care/ADL training;Neuromuscular education;DME and/or AE instruction;Manual therapy;Therapeutic activities;Cognitive remediation/compensation;Visual/perceptual remediation/compensation;Patient/family education;Balance training    OT Goals(Current goals can be found in the care plan section) Acute Rehab OT Goals OT Goal Formulation: Patient unable to participate in goal setting Time For Goal Achievement: 09/30/15 Potential to Achieve Goals: Good ADL Goals Pt Will Perform Eating: with min assist;sitting Pt Will Perform Grooming: with min assist;sitting Pt Will Perform Upper Body Bathing: with mod assist;sitting Pt Will Transfer to Toilet: with min assist;ambulating;regular height toilet;bedside commode;grab bars Pt Will Perform Toileting - Clothing Manipulation and hygiene: with mod assist;sit to/from stand Additional ADL Goal #1: Pt will locate items on Rt with min cues during familiar ADL task  Additional ADL Goal #2: Pt will use Rt UE as  gross assist during ADLs.   OT Frequency: Min 2X/week   Barriers to D/C: Decreased caregiver support  unsure if pt has adequate family support        Co-evaluation PT/OT/SLP Co-Evaluation/Treatment: Yes Reason for Co-Treatment: For patient/therapist safety   OT goals addressed during session: ADL's and self-care  End of Session Nurse Communication: Mobility status  Activity Tolerance: Patient tolerated treatment well Patient left: in chair;with call bell/phone within reach;with restraints reapplied   Time: 1100-1137 OT Time Calculation (min): 37 min Charges:  OT  General Charges $OT Visit: 1 Procedure OT Evaluation $Initial OT Evaluation Tier I: 1 Procedure G-Codes:    Lucille Passy M 09/19/2015, 6:45 PM

## 2015-09-17 DIAGNOSIS — D72819 Decreased white blood cell count, unspecified: Secondary | ICD-10-CM | POA: Insufficient documentation

## 2015-09-17 LAB — CBC WITH DIFFERENTIAL/PLATELET
BASOS PCT: 0 %
Basophils Absolute: 0 10*3/uL (ref 0.0–0.1)
Eosinophils Absolute: 0 10*3/uL (ref 0.0–0.7)
Eosinophils Relative: 2 %
HEMATOCRIT: 27.7 % — AB (ref 39.0–52.0)
HEMOGLOBIN: 9.4 g/dL — AB (ref 13.0–17.0)
LYMPHS ABS: 0.6 10*3/uL — AB (ref 0.7–4.0)
LYMPHS PCT: 27 %
MCH: 32.6 pg (ref 26.0–34.0)
MCHC: 33.9 g/dL (ref 30.0–36.0)
MCV: 96.2 fL (ref 78.0–100.0)
MONO ABS: 0.2 10*3/uL (ref 0.1–1.0)
MONOS PCT: 7 %
NEUTROS ABS: 1.5 10*3/uL — AB (ref 1.7–7.7)
NEUTROS PCT: 64 %
Platelets: 42 10*3/uL — ABNORMAL LOW (ref 150–400)
RBC: 2.88 MIL/uL — ABNORMAL LOW (ref 4.22–5.81)
RDW: 11.9 % (ref 11.5–15.5)
WBC: 2.3 10*3/uL — ABNORMAL LOW (ref 4.0–10.5)

## 2015-09-17 LAB — PREPARE PLATELET PHERESIS: UNIT DIVISION: 0

## 2015-09-17 LAB — GLUCOSE, CAPILLARY
GLUCOSE-CAPILLARY: 114 mg/dL — AB (ref 65–99)
Glucose-Capillary: 74 mg/dL (ref 65–99)
Glucose-Capillary: 103 mg/dL — ABNORMAL HIGH (ref 65–99)
Glucose-Capillary: 144 mg/dL — ABNORMAL HIGH (ref 65–99)

## 2015-09-17 NOTE — Progress Notes (Signed)
Rehab admissions - I met with patient and his two sons.  Sons prefer rehab in [place], [place].  They mentioned a facility called "[facility]" as their first choice and the "[facility]" as their second choice.  Call me for questions.  #[phone]

## 2015-09-17 NOTE — Care Management Note (Signed)
Case Management Note  Patient Details  Name: Benjamin Schroeder MRN: [id number] Date of Birth: 1946-04-04  Subjective/Objective:   Pt admitted on 09/13/15 with SDH s/p craniotomy.  PTA, pt independent, lives alone in [place], [place].  He has two supportive sons at bedside; one lives in [place], one in [place].                   Action/Plan: Met with pt and sons to discuss dc plans.  Sons prefer SNF in or near [place], [place] over [place] [employer].  CSW consulted to facilitate dc to SNF when medically stable for discharge.  Will follow progress.    Expected Discharge Date:                  Expected Discharge Plan:  Skilled Nursing Facility  In-House Referral:  Clinical Social Work  Discharge planning Services  CM Consult  Post Acute Care Choice:    Choice offered to:     DME Arranged:    DME Agency:     HH Arranged:    [place] Agency:     Status of Service:  In process, will continue to follow  Medicare Important Message Given:  Yes Date Medicare IM Given:    Medicare IM give by:    Date Additional Medicare IM Given:    Additional Medicare Important Message give by:     If discussed at [place] of Stay Meetings, dates discussed:    Additional Comments:  Reinaldo Raddle, RN, BSN  Trauma/Neuro ICU Case Manager [phone]

## 2015-09-17 NOTE — Care Management Important Message (Signed)
Important Message  Patient Details  Name: Benjamin Schroeder MRN: [id number] Date of Birth: Mar 15, 1946   Medicare Important Message Given:  Yes    Nathen May 09/17/2015, 12:22 PM

## 2015-09-17 NOTE — Progress Notes (Signed)
Physical Therapy Treatment Patient Details Name: Benjamin Schroeder MRN: [id number] DOB: [birth year] Today's Date: 09/17/2015    History of Present Illness Pt is a 32 male who suffered a mechanical fall 3-4 weeks ago who came to ED with HA on 11/25. Pt found to have L SDH. Pt s/p L craniotomy for evactuation of subdural hematoma on 11/25.    PT Comments    Pt continues to require gestural cueing for following directions and participating in mobility.  Per sons, they would like pt to return to [place] for further rehab.  Updated D/C plan.    Follow Up Recommendations  SNF     Equipment Recommendations  None recommended by PT    Recommendations for Other Services       Precautions / Restrictions Precautions Precautions: Fall Restrictions Weight Bearing Restrictions: No    Mobility  Bed Mobility Overal bed mobility: Needs Assistance;+2 for physical assistance Bed Mobility: Supine to Sit     Supine to sit: Mod assist;HOB elevated     General bed mobility comments: A for bil LEs and MinA at trunk to A with coming to sitting.  Gestural cues needed.    Transfers Overall transfer level: Needs assistance Equipment used: 2 person hand held assist Transfers: Sit to/from [facility] Sit to Stand: Mod assist;+2 physical assistance Stand pivot transfers: Max assist;+2 physical assistance       General transfer comment: pt with posterior lean and needs facilitation for anterior weight shift over BOS.  pt also needing facilitation to unweight R LE and advance R LE.  Repeated coming to stand for peri hygiene.    Ambulation/Gait                 Stairs            Wheelchair Mobility    Modified Rankin (Stroke Patients Only) Modified Rankin (Stroke Patients Only) Pre-Morbid Rankin Score: No symptoms Modified Rankin: Severe disability     Balance Overall balance assessment: Needs assistance Sitting-balance support: No upper extremity supported;Feet  supported Sitting balance-Leahy Scale: Fair Sitting balance - Comments: pt leans posteriorly, but is able to self-correct with verbal and tactile cueing.  pt clearly fatigues and needs increased A the longer he is sitting.   Postural control: Posterior lean;Right lateral lean Standing balance support: During functional activity Standing balance-Leahy Scale: Poor                      Cognition Arousal/Alertness: Awake/alert Behavior During Therapy: Flat affect Overall Cognitive Status: Difficult to assess Area of Impairment: Following commands;Problem solving       Following Commands: Follows one step commands inconsistently;Follows one step commands with increased time     Problem Solving: Requires verbal cues;Requires tactile cues General Comments: Pt with significant communication deficits as well as potential ideomotor and ideational apraxias which make it very difficult to accurately assess cognition    Exercises      General Comments        Pertinent Vitals/Pain Pain Assessment: Faces Faces Pain Scale: No hurt    Home Living                      Prior Function            PT Goals (current goals can now be found in the care plan section) Acute Rehab PT Goals Patient Stated Goal: pt unable to state. PT Goal Formulation: Patient unable to participate in goal setting Time  For Goal Achievement: 09/29/15 Potential to Achieve Goals: Good Progress towards PT goals: Progressing toward goals    Frequency  Min 3X/week    PT Plan Discharge plan needs to be updated;Frequency needs to be updated    Co-evaluation             End of Session Equipment Utilized During Treatment: Gait belt Activity Tolerance: Patient tolerated treatment well Patient left: in chair;with call bell/phone within reach;with chair alarm set;with family/visitor present;with restraints reapplied     Time: 1023-1050 PT Time Calculation (min) (ACUTE ONLY): 27  min  Charges:  $Therapeutic Activity: 23-37 mins                    G CodesCatarina Hartshorn, [place] [phone] 09/17/2015, 11:57 AM

## 2015-09-17 NOTE — NC FL2 (Signed)
[place] LEVEL OF CARE SCREENING TOOL     IDENTIFICATION  Patient Name: Benjamin Schroeder Birthdate: Nov 24, 1945 Sex: male Admission Date (Current Location): 09/13/2015  [facility] and [place] Number:     Facility and Address:  [facility] [place], [place] [address] [place], [place] [address]      Provider Number: [id number]  Attending Physician Name and Address:  Eustace Moore, MD  Relative Name and Phone Number:       Current Level of Care: Hospital Recommended Level of Care: [place] Prior Approval Number:    Date Approved/Denied:   PASRR Number:    Discharge Plan: SNF    Current Diagnoses: Patient Active Problem List   Diagnosis Date Noted  . Leukopenia   . Essential hypertension   . Type 2 diabetes mellitus with complication ([place])   . Hepatic cirrhosis ([place])   . Thrombocytopenia ([place])   . Traumatic brain injury ([place])   . 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 SDH (subdural hematoma) ([place]) 09/13/2015  . Subdural hematoma (H[facility]) 09/13/2015    Orientation ACTIVITIES/SOCIAL BLADDER RESPIRATION    Self    Incontinent Normal  BEHAVIORAL SYMPTOMS/MOOD NEUROLOGICAL BOWEL NUTRITION STATUS      Incontinent Diet (carb modified)  PHYSICIAN VISITS COMMUNICATION OF NEEDS Height & Weight Skin    Verbally 6' (182.9 cm) 184 lbs. Surgical wounds          AMBULATORY STATUS RESPIRATION    Assist extensive Normal      Personal Care Assistance Level of Assistance  Bathing, Dressing, Feeding Bathing Assistance: Maximum assistance Feeding assistance: Maximum assistance Dressing Assistance: Maximum assistance      Functional Limitations Info                [place]  PT (By licensed PT)     PT Frequency: 5/wk             Additional Factors Info  Code Status, Allergies, Insulin Sliding Scale Code Status Info: FULL Allergies Info: NKA   Insulin Sliding  Scale Info: 4/day       Current Medications (09/17/2015):  This is the current hospital active medication list Current Facility-Administered Medications  Medication Dose Route Frequency Provider Last Rate Last Dose  . 0.9 %  sodium chloride infusion   Intravenous Once Eustace Moore, MD      . 0.9 %  sodium chloride infusion   Intravenous Once Kary Kos, MD      . 0.9 %  sodium chloride infusion   Intravenous Once Kary Kos, MD      . 0.9 % NaCl with KCl 20 mEq/ L  infusion   Intravenous Continuous Eustace Moore, MD 75 mL/hr at 09/17/15 1000    . acetaminophen (TYLENOL) tablet 650 mg  650 mg Oral Q4H PRN Eustace Moore, MD       Or  . acetaminophen (TYLENOL) suppository 650 mg  650 mg Rectal Q4H PRN Eustace Moore, MD      . aminocaproic acid (AMICAR) 2 g in sodium chloride 0.9 % 50 mL infusion  2 g Intravenous Q6H Annia Belt, MD   2 g at 09/17/15 1027  . atenolol (TENORMIN) tablet 25 mg  25 mg Oral Daily Eustace Moore, MD   25 mg at 09/17/15 1026  . docusate sodium (COLACE) capsule 100 mg  100 mg Oral BID Eustace Moore, MD  100 mg at 09/16/15 2243  . ferrous sulfate tablet 325 mg  325 mg Oral Q breakfast Eustace Moore, MD   325 mg at 09/17/15 0841  . glipiZIDE (GLUCOTROL) tablet 10 mg  10 mg Oral BID AC Eustace Moore, MD   10 mg at 09/17/15 0841  . HYDROcodone-acetaminophen (NORCO/VICODIN) 5-325 MG per tablet 1 tablet  1 tablet Oral Q4H PRN Eustace Moore, MD      . insulin aspart (novoLOG) injection 0-15 Units  0-15 Units Subcutaneous TID WC Eustace Moore, MD   3 Units at 09/16/15 1215  . insulin aspart (novoLOG) injection 0-5 Units  0-5 Units Subcutaneous QHS Eustace Moore, MD   0 Units at 09/16/15 2200  . labetalol (NORMODYNE,TRANDATE) injection 10-40 mg  10-40 mg Intravenous Q10 min PRN Eustace Moore, MD      . levETIRAcetam (KEPPRA) tablet 500 mg  500 mg Oral BID Norva Riffle, RPH   500 mg at 09/17/15 1026  . lisinopril (PRINIVIL,ZESTRIL) tablet 20 mg  20 mg Oral Daily Eustace Moore, MD   20 mg at 09/17/15 1026  . morphine 2 MG/ML injection 1-2 mg  1-2 mg Intravenous Q2H PRN Eustace Moore, MD      . ondansetron [facility]) tablet 4 mg  4 mg Oral Q4H PRN Eustace Moore, MD       Or  . ondansetron [facility]) injection 4 mg  4 mg Intravenous Q4H PRN Eustace Moore, MD      . phytonadione (VITAMIN K) tablet 5 mg  5 mg Oral Daily Annia Belt, MD   5 mg at 09/17/15 1026  . promethazine (PHENERGAN) tablet 12.5-25 mg  12.5-25 mg Oral Q4H PRN Eustace Moore, MD         Discharge Medications: Please see discharge summary for a list of discharge medications.  Relevant Imaging Results:  Relevant Lab Results:  Recent Labs    Additional Information SS#: [id number]  Cranford Mon, LCSW

## 2015-09-17 NOTE — Progress Notes (Signed)
Patient ID: Benjamin Schroeder, male   DOB: 1946-08-12, 69 y.o.   MRN: [id number] Subjective: Patient remains globally aphasic and mildly right hemiparetic. Therefore he cannot cooperate with history. I think he does have some understanding of what I ask and shakes his head that he does not have significant headache.  Objective: Vital signs in last 24 hours: Temp:  [97.5 F (36.4 C)-99 F (37.2 C)] 99 F (37.2 C) (11/29 0745) Pulse Rate:  [63-80] 72 (11/29 0500) Resp:  [12-22] 12 (11/29 0500) BP: (121-143)/(30-101) 133/68 mmHg (11/29 0500) SpO2:  [94 %-100 %] 100 % (11/29 0500)  Intake/Output from previous day: 11/28 0701 - 11/29 0700 In: 2194.8 [I.V.:1650; Blood:428.8; IV Piggyback:116] Out: 2510 [Urine:2300; Drains:210] Intake/Output this shift:    Awake and alert, regards the examiner, follows me about the room with his eyes and therefore he tracks, he can lift his right arm and localize but I do think it is somewhat paretic, I think he remains fairly globally a phasic will say things such as allergies and all right  Lab Results: Lab Results  Component Value Date   WBC 2.3* 09/17/2015   HGB 9.4* 09/17/2015   HCT 27.7* 09/17/2015   MCV 96.2 09/17/2015   PLT 42* 09/17/2015   Lab Results  Component Value Date   INR 1.33 09/16/2015   BMET Lab Results  Component Value Date   NA 130* 09/16/2015   K 4.3 09/16/2015   CL 102 09/16/2015   CO2 23 09/16/2015   GLUCOSE 121* 09/16/2015   BUN 16 09/16/2015   CREATININE 1.18 09/16/2015   CALCIUM 8.4* 09/16/2015    Studies/Results: Ct Head Wo Contrast  09/16/2015  CLINICAL DATA:  Subdural hematoma.  Craniotomy. EXAM: CT HEAD WITHOUT CONTRAST TECHNIQUE: Contiguous axial images were obtained from the base of the skull through the vertex without intravenous contrast. COMPARISON:  CT head without contrast 09/14/2015 and 09/13/2015. FINDINGS: A left frontal craniotomy is again noted. An extracranial drainage catheter remains in place. The  hyperdense extracranial collection is slightly larger than on the prior study. Mass effect has slightly increased as well with increased effacement of the left lateral ventricle and slight increase in midline shift, now measuring 5.5 mm at the level of the foramen of [place]ro. No discrete new hemorrhage is present. No acute cortical infarct is evident. Circumferential mucosal thickening is again noted in the right maxillary sinus. The remaining paranasal sinuses are clear. The mastoid air cells are clear. Atherosclerotic calcifications are present at the dural margin of the vertebral arteries and within the cavernous internal carotid arteries. IMPRESSION: 1. Slight increase in the extent of extra-axial high-density fluid/hemorrhage. 2. Slight increased mass effect with midline shift now measuring 5.5 mm. Electronically Signed   By: San Morelle M.D.   On: 09/16/2015 08:13    Assessment/Plan: I think he has clinical irritation from the remaining blood products and the surgery, and this is making a mildly right hemiparetic and a phasic. I do not think that further surgery is indicated given his alertness and the fact that he has a platelet count of 30-40,000 and therefore surgery may lead to further complications instead of improvement. Think this is best if we simply give this time as I think he will improve with time. We will repeat his head CT tomorrow   LOS: 4 days    Alisandra Son S 09/17/2015, 8:32 AM

## 2015-09-17 NOTE — Progress Notes (Signed)
Patient ID: Benjamin Schroeder, male   DOB: 19-Dec-1945, 69 y.o.   MRN: [id number] Hematology See 11/18 consult note PO day 4 More responsive today; follows some commands; persistent left ptosis; RUE plegia; getting a flexion contracture. He did get a bump in his platelet count yesterday after platelet transfusion to 47,000.  42,000 this AM. Hb 9.4 Cranial drain with scant blood tinged fluid; total drainage 210 ml yesterday stable c/w day before and appears that this is mostly CSF, not blood. AMICAR IV  & Vitamin K PO started 11/28 WBC falling @ 2,300 Impression: 1. 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 Reserve further platelet transfusion if signs of increased bleeding. 2. Falling WBC   Rec: consider change to alternative anticonvulsant. No other obvious offending meds.

## 2015-09-18 ENCOUNTER — Encounter (HOSPITAL_COMMUNITY): Payer: Self-pay | Admitting: Certified Registered Nurse Anesthetist

## 2015-09-18 ENCOUNTER — Inpatient Hospital Stay (HOSPITAL_COMMUNITY): Payer: Medicare Other

## 2015-09-18 ENCOUNTER — Inpatient Hospital Stay (HOSPITAL_COMMUNITY): Payer: Medicare Other | Admitting: Certified Registered Nurse Anesthetist

## 2015-09-18 ENCOUNTER — Encounter (HOSPITAL_COMMUNITY)
Admission: EM | Disposition: A | Discharge status: Skilled Nursing Facility | Payer: Self-pay | Source: Home / Self Care | Attending: Neurological Surgery

## 2015-09-18 DIAGNOSIS — Z01818 Encounter for other preprocedural examination: Secondary | ICD-10-CM | POA: Insufficient documentation

## 2015-09-18 DIAGNOSIS — K7460 Unspecified cirrhosis of liver: Secondary | ICD-10-CM | POA: Insufficient documentation

## 2015-09-18 DIAGNOSIS — G40901 Epilepsy, unspecified, not intractable, with status epilepticus: Secondary | ICD-10-CM

## 2015-09-18 HISTORY — PX: CRANIOTOMY: SHX93

## 2015-09-18 LAB — BLOOD GAS, ARTERIAL
ACID-BASE DEFICIT: 6.3 mmol/L — AB (ref 0.0–2.0)
Bicarbonate: 17.3 mEq/L — ABNORMAL LOW (ref 20.0–24.0)
FIO2: 1
LHR: 14 {breaths}/min
O2 SAT: 100 %
PATIENT TEMPERATURE: 98.6
PCO2 ART: 27.4 mmHg — AB (ref 35.0–45.0)
PEEP: 5 cmH2O
PH ART: 7.417 (ref 7.350–7.450)
PO2 ART: 501 mmHg — AB (ref 80.0–100.0)
TCO2: 18.2 mmol/L (ref 0–100)
VT: 620 mL

## 2015-09-18 LAB — PROTIME-INR
INR: 1.2 (ref 0.00–1.49)
Prothrombin Time: 15.3 seconds — ABNORMAL HIGH (ref 11.6–15.2)

## 2015-09-18 LAB — TYPE AND SCREEN
ABO/RH(D): O POS
Antibody Screen: NEGATIVE

## 2015-09-18 LAB — GLUCOSE, CAPILLARY
GLUCOSE-CAPILLARY: 179 mg/dL — AB (ref 65–99)
GLUCOSE-CAPILLARY: 200 mg/dL — AB (ref 65–99)
Glucose-Capillary: 97 mg/dL (ref 65–99)
Glucose-Capillary: 224 mg/dL — ABNORMAL HIGH (ref 65–99)

## 2015-09-18 LAB — CBC WITH DIFFERENTIAL/PLATELET
BASOS PCT: 0 %
Basophils Absolute: 0 10*3/uL (ref 0.0–0.1)
Eosinophils Absolute: 0 10*3/uL (ref 0.0–0.7)
Eosinophils Relative: 1 %
HEMATOCRIT: 28 % — AB (ref 39.0–52.0)
HEMOGLOBIN: 9.7 g/dL — AB (ref 13.0–17.0)
Lymphocytes Relative: 26 %
Lymphs Abs: 0.7 10*3/uL (ref 0.7–4.0)
MCH: 33.2 pg (ref 26.0–34.0)
MCHC: 34.6 g/dL (ref 30.0–36.0)
MCV: 95.9 fL (ref 78.0–100.0)
MONO ABS: 0.2 10*3/uL (ref 0.1–1.0)
Monocytes Relative: 8 %
NEUTROS ABS: 1.6 10*3/uL — AB (ref 1.7–7.7)
Neutrophils Relative %: 64 %
Platelets: 82 10*3/uL — ABNORMAL LOW (ref 150–400)
RBC: 2.92 MIL/uL — ABNORMAL LOW (ref 4.22–5.81)
RDW: 11.9 % (ref 11.5–15.5)
WBC: 2.5 10*3/uL — ABNORMAL LOW (ref 4.0–10.5)

## 2015-09-18 LAB — AST: AST: 33 U/L (ref 15–41)

## 2015-09-18 LAB — ALT: ALT: 35 U/L (ref 17–63)

## 2015-09-18 SURGERY — CRANIOTOMY HEMATOMA EVACUATION SUBDURAL
Anesthesia: General | Site: Head | Laterality: Left

## 2015-09-18 MED ORDER — ANTISEPTIC ORAL RINSE SOLUTION (CORINZ)
7.0000 mL | Freq: Four times a day (QID) | OROMUCOSAL | Status: DC
  Administered 2015-09-19 (×4): 7 mL via OROMUCOSAL

## 2015-09-18 MED ORDER — HEMOSTATIC AGENTS (NO CHARGE) OPTIME
TOPICAL | Status: DC | PRN
  Administered 2015-09-18 (×2): 1 via TOPICAL

## 2015-09-18 MED ORDER — DEXAMETHASONE SODIUM PHOSPHATE 4 MG/ML IJ SOLN
4.0000 mg | Freq: Four times a day (QID) | INTRAMUSCULAR | Status: AC
Start: 2015-09-18 — End: 2015-09-19
  Administered 2015-09-18 – 2015-09-19 (×4): 4 mg via INTRAVENOUS
  Filled 2015-09-18 (×4): qty 1

## 2015-09-18 MED ORDER — ONDANSETRON HCL 4 MG/2ML IJ SOLN: 4.0000 mg | INTRAMUSCULAR | Status: DC | PRN

## 2015-09-18 MED ORDER — LIDOCAINE HCL (CARDIAC) 20 MG/ML IV SOLN
INTRAVENOUS | Status: DC | PRN
  Administered 2015-09-18: 100 mg via INTRAVENOUS

## 2015-09-18 MED ORDER — PHENYLEPHRINE HCL 10 MG/ML IJ SOLN
INTRAMUSCULAR | Status: DC | PRN
  Administered 2015-09-18 (×2): 80 ug via INTRAVENOUS

## 2015-09-18 MED ORDER — FENTANYL CITRATE (PF) 100 MCG/2ML IJ SOLN: 50.0000 ug | INTRAMUSCULAR | Status: DC | PRN

## 2015-09-18 MED ORDER — LIDOCAINE HCL (CARDIAC) 20 MG/ML IV SOLN
INTRAVENOUS | Status: AC
  Filled 2015-09-18: qty 5

## 2015-09-18 MED ORDER — VITAL HIGH PROTEIN PO LIQD
1000.0000 mL | ORAL | Status: DC
Start: 2015-09-18 — End: 2015-09-19
  Administered 2015-09-18: 22:00:00
  Administered 2015-09-18: 1000 mL
  Administered 2015-09-19: 02:00:00
  Filled 2015-09-18 (×3): qty 1000

## 2015-09-18 MED ORDER — ONDANSETRON HCL 4 MG/2ML IJ SOLN: 4.0000 mg | Freq: Once | INTRAMUSCULAR | Status: DC | PRN

## 2015-09-18 MED ORDER — MORPHINE SULFATE (PF) 2 MG/ML IV SOLN: 1.0000 mg | INTRAVENOUS | Status: DC | PRN

## 2015-09-18 MED ORDER — SODIUM CHLORIDE 0.9 % IV SOLN
500.0000 mg | Freq: Once | INTRAVENOUS | Status: AC
  Administered 2015-09-18: 500 mg via INTRAVENOUS
  Filled 2015-09-18: qty 5

## 2015-09-18 MED ORDER — CEFAZOLIN SODIUM-DEXTROSE 2-3 GM-% IV SOLR
INTRAVENOUS | Status: AC
  Filled 2015-09-18: qty 50

## 2015-09-18 MED ORDER — CEFAZOLIN SODIUM 1-5 GM-% IV SOLN
1.0000 g | Freq: Three times a day (TID) | INTRAVENOUS | Status: AC
  Administered 2015-09-18 – 2015-09-19 (×2): 1 g via INTRAVENOUS
  Filled 2015-09-18 (×3): qty 50

## 2015-09-18 MED ORDER — FENTANYL CITRATE (PF) 250 MCG/5ML IJ SOLN
INTRAMUSCULAR | Status: AC
  Filled 2015-09-18: qty 5

## 2015-09-18 MED ORDER — LEVETIRACETAM 500 MG/5ML IV SOLN
500.0000 mg | Freq: Two times a day (BID) | INTRAVENOUS | Status: DC
  Filled 2015-09-18: qty 5

## 2015-09-18 MED ORDER — MIDAZOLAM HCL 2 MG/2ML IJ SOLN
INTRAMUSCULAR | Status: AC
  Filled 2015-09-18: qty 2

## 2015-09-18 MED ORDER — CHLORHEXIDINE GLUCONATE 0.12% ORAL RINSE (MEDLINE KIT)
15.0000 mL | Freq: Two times a day (BID) | OROMUCOSAL | Status: DC
  Administered 2015-09-18 – 2015-09-19 (×2): 15 mL via OROMUCOSAL

## 2015-09-18 MED ORDER — SURGIFOAM 100 EX MISC
CUTANEOUS | Status: DC | PRN
  Administered 2015-09-18: 14:00:00 via TOPICAL

## 2015-09-18 MED ORDER — PROMETHAZINE HCL 25 MG PO TABS: 12.5000 mg | ORAL_TABLET | ORAL | Status: DC | PRN

## 2015-09-18 MED ORDER — DEXAMETHASONE SODIUM PHOSPHATE 10 MG/ML IJ SOLN
10.0000 mg | Freq: Once | INTRAMUSCULAR | Status: AC
  Administered 2015-09-18: 10 mg via INTRAVENOUS

## 2015-09-18 MED ORDER — SUGAMMADEX SODIUM 200 MG/2ML IV SOLN
INTRAVENOUS | Status: DC | PRN
  Administered 2015-09-18: 200 mg via INTRAVENOUS

## 2015-09-18 MED ORDER — FENTANYL CITRATE (PF) 100 MCG/2ML IJ SOLN
INTRAMUSCULAR | Status: AC
  Administered 2015-09-18: 100 ug
  Filled 2015-09-18: qty 2

## 2015-09-18 MED ORDER — PHENYLEPHRINE HCL 10 MG/ML IJ SOLN
10.0000 mg | INTRAVENOUS | Status: DC | PRN
  Administered 2015-09-18: 20 ug/min via INTRAVENOUS

## 2015-09-18 MED ORDER — PROPOFOL 1000 MG/100ML IV EMUL
5.0000 ug/kg/min | INTRAVENOUS | Status: DC
  Administered 2015-09-18 – 2015-09-19 (×3): 20 ug/kg/min via INTRAVENOUS
  Filled 2015-09-18 (×3): qty 100

## 2015-09-18 MED ORDER — LORAZEPAM 2 MG/ML IJ SOLN
INTRAMUSCULAR | Status: AC
  Administered 2015-09-18: 2 mg
  Filled 2015-09-18: qty 1

## 2015-09-18 MED ORDER — ONDANSETRON HCL 4 MG/2ML IJ SOLN
INTRAMUSCULAR | Status: AC
  Filled 2015-09-18: qty 2

## 2015-09-18 MED ORDER — FENTANYL CITRATE (PF) 100 MCG/2ML IJ SOLN: 100.0000 ug | Freq: Once | INTRAMUSCULAR | Status: AC

## 2015-09-18 MED ORDER — LABETALOL HCL 5 MG/ML IV SOLN
10.0000 mg | INTRAVENOUS | Status: DC | PRN
  Administered 2015-09-21: 20 mg via INTRAVENOUS
  Filled 2015-09-18: qty 4

## 2015-09-18 MED ORDER — SODIUM CHLORIDE 0.9 % IV SOLN
1500.0000 mg | Freq: Once | INTRAVENOUS | Status: AC
  Administered 2015-09-18: 750 mg via INTRAVENOUS
  Filled 2015-09-18: qty 30

## 2015-09-18 MED ORDER — HYDROCODONE-ACETAMINOPHEN 5-325 MG PO TABS: 1.0000 | ORAL_TABLET | ORAL | Status: DC | PRN

## 2015-09-18 MED ORDER — PROPOFOL 10 MG/ML IV BOLUS
INTRAVENOUS | Status: DC | PRN
  Administered 2015-09-18: 140 mg via INTRAVENOUS

## 2015-09-18 MED ORDER — MEPERIDINE HCL 25 MG/ML IJ SOLN: 6.2500 mg | INTRAMUSCULAR | Status: DC | PRN

## 2015-09-18 MED ORDER — HYDROMORPHONE HCL 1 MG/ML IJ SOLN: 0.2500 mg | INTRAMUSCULAR | Status: DC | PRN

## 2015-09-18 MED ORDER — CEFAZOLIN SODIUM-DEXTROSE 2-3 GM-% IV SOLR
INTRAVENOUS | Status: DC | PRN
  Administered 2015-09-18: 2 g via INTRAVENOUS

## 2015-09-18 MED ORDER — PROPOFOL 1000 MG/100ML IV EMUL
INTRAVENOUS | Status: AC
  Filled 2015-09-18: qty 100

## 2015-09-18 MED ORDER — INSULIN ASPART 100 UNIT/ML ~~LOC~~ SOLN
0.0000 [IU] | SUBCUTANEOUS | Status: DC
  Administered 2015-09-18: 5 [IU] via SUBCUTANEOUS
  Administered 2015-09-18: 3 [IU] via SUBCUTANEOUS
  Administered 2015-09-19 (×2): 5 [IU] via SUBCUTANEOUS
  Administered 2015-09-19 (×4): 3 [IU] via SUBCUTANEOUS
  Administered 2015-09-20: 2 [IU] via SUBCUTANEOUS
  Administered 2015-09-20: 3 [IU] via SUBCUTANEOUS
  Administered 2015-09-20 (×3): 2 [IU] via SUBCUTANEOUS
  Administered 2015-09-20 – 2015-09-21 (×3): 3 [IU] via SUBCUTANEOUS
  Administered 2015-09-21 (×2): 2 [IU] via SUBCUTANEOUS
  Administered 2015-09-21: 3 [IU] via SUBCUTANEOUS
  Administered 2015-09-21: 2 [IU] via SUBCUTANEOUS
  Administered 2015-09-22 (×4): 5 [IU] via SUBCUTANEOUS
  Administered 2015-09-22: 2 [IU] via SUBCUTANEOUS
  Administered 2015-09-23: 3 [IU] via SUBCUTANEOUS
  Administered 2015-09-23: 5 [IU] via SUBCUTANEOUS
  Administered 2015-09-23 (×2): 3 [IU] via SUBCUTANEOUS
  Administered 2015-09-23: 2 [IU] via SUBCUTANEOUS
  Administered 2015-09-24: 8 [IU] via SUBCUTANEOUS
  Administered 2015-09-24: 5 [IU] via SUBCUTANEOUS
  Administered 2015-09-24: 8 [IU] via SUBCUTANEOUS
  Administered 2015-09-24: 5 [IU] via SUBCUTANEOUS
  Administered 2015-09-24: 2 [IU] via SUBCUTANEOUS
  Administered 2015-09-24: 5 [IU] via SUBCUTANEOUS
  Administered 2015-09-24: 3 [IU] via SUBCUTANEOUS
  Administered 2015-09-25 (×2): 8 [IU] via SUBCUTANEOUS
  Administered 2015-09-25: 5 [IU] via SUBCUTANEOUS
  Administered 2015-09-25: 3 [IU] via SUBCUTANEOUS
  Administered 2015-09-25: 2 [IU] via SUBCUTANEOUS
  Administered 2015-09-26: 5 [IU] via SUBCUTANEOUS
  Administered 2015-09-26: 3 [IU] via SUBCUTANEOUS
  Administered 2015-09-26: 11 [IU] via SUBCUTANEOUS
  Administered 2015-09-26: 3 [IU] via SUBCUTANEOUS
  Administered 2015-09-27: 5 [IU] via SUBCUTANEOUS
  Administered 2015-09-27: 2 [IU] via SUBCUTANEOUS
  Administered 2015-09-27: 5 [IU] via SUBCUTANEOUS
  Administered 2015-09-27 – 2015-09-28 (×6): 2 [IU] via SUBCUTANEOUS

## 2015-09-18 MED ORDER — PHENYTOIN SODIUM 50 MG/ML IJ SOLN
100.0000 mg | Freq: Three times a day (TID) | INTRAMUSCULAR | Status: DC
  Administered 2015-09-18 – 2015-09-23 (×14): 100 mg via INTRAVENOUS
  Filled 2015-09-18 (×14): qty 2

## 2015-09-18 MED ORDER — THROMBIN 5000 UNITS EX SOLR
OROMUCOSAL | Status: DC | PRN
  Administered 2015-09-18: 14:00:00 via TOPICAL

## 2015-09-18 MED ORDER — SODIUM CHLORIDE 0.9 % IR SOLN
Status: DC | PRN
  Administered 2015-09-18: 14:00:00

## 2015-09-18 MED ORDER — 0.9 % SODIUM CHLORIDE (POUR BTL) OPTIME
TOPICAL | Status: DC | PRN
  Administered 2015-09-18 (×2): 1000 mL

## 2015-09-18 MED ORDER — FENTANYL CITRATE (PF) 100 MCG/2ML IJ SOLN
INTRAMUSCULAR | Status: DC | PRN
  Administered 2015-09-18: 150 ug via INTRAVENOUS
  Administered 2015-09-18 (×2): 50 ug via INTRAVENOUS

## 2015-09-18 MED ORDER — MIDAZOLAM HCL 2 MG/2ML IJ SOLN: 2.0000 mg | Freq: Once | INTRAMUSCULAR | Status: AC

## 2015-09-18 MED ORDER — SUGAMMADEX SODIUM 200 MG/2ML IV SOLN
INTRAVENOUS | Status: AC
  Filled 2015-09-18: qty 2

## 2015-09-18 MED ORDER — MIDAZOLAM HCL 2 MG/2ML IJ SOLN: 1.0000 mg | INTRAMUSCULAR | Status: DC | PRN

## 2015-09-18 MED ORDER — DEXAMETHASONE SODIUM PHOSPHATE 10 MG/ML IJ SOLN
INTRAMUSCULAR | Status: AC
  Filled 2015-09-18: qty 1

## 2015-09-18 MED ORDER — ARTIFICIAL TEARS OP OINT
TOPICAL_OINTMENT | OPHTHALMIC | Status: AC
  Filled 2015-09-18: qty 3.5

## 2015-09-18 MED ORDER — PROPOFOL 10 MG/ML IV BOLUS
INTRAVENOUS | Status: AC
  Filled 2015-09-18: qty 20

## 2015-09-18 MED ORDER — SODIUM CHLORIDE 0.9 % IV SOLN
1000.0000 mg | Freq: Two times a day (BID) | INTRAVENOUS | Status: DC
  Administered 2015-09-18 – 2015-09-23 (×10): 1000 mg via INTRAVENOUS
  Filled 2015-09-18 (×12): qty 10

## 2015-09-18 MED ORDER — BACITRACIN ZINC 500 UNIT/GM EX OINT
TOPICAL_OINTMENT | CUTANEOUS | Status: DC | PRN
  Administered 2015-09-18: 1 via TOPICAL

## 2015-09-18 MED ORDER — POTASSIUM CHLORIDE IN NACL 20-0.9 MEQ/L-% IV SOLN
INTRAVENOUS | Status: DC
  Administered 2015-09-19: 18:00:00 via INTRAVENOUS
  Filled 2015-09-18 (×5): qty 1000

## 2015-09-18 MED ORDER — DEXTROSE 5 % IV SOLN
5.0000 mg | Freq: Once | INTRAVENOUS | Status: AC
  Administered 2015-09-18: 5 mg via INTRAVENOUS
  Filled 2015-09-18: qty 0.5

## 2015-09-18 MED ORDER — ACETAMINOPHEN 325 MG PO TABS
650.0000 mg | ORAL_TABLET | ORAL | Status: DC | PRN
  Administered 2015-09-21: 650 mg via ORAL
  Filled 2015-09-18: qty 2

## 2015-09-18 MED ORDER — PANTOPRAZOLE SODIUM 40 MG IV SOLR
40.0000 mg | Freq: Every day | INTRAVENOUS | Status: DC
  Administered 2015-09-18 – 2015-09-24 (×7): 40 mg via INTRAVENOUS
  Filled 2015-09-18 (×7): qty 40

## 2015-09-18 MED ORDER — ROCURONIUM BROMIDE 100 MG/10ML IV SOLN
INTRAVENOUS | Status: DC | PRN
  Administered 2015-09-18: 50 mg via INTRAVENOUS

## 2015-09-18 MED ORDER — ACETAMINOPHEN 650 MG RE SUPP: 650.0000 mg | RECTAL | Status: DC | PRN

## 2015-09-18 MED ORDER — ROCURONIUM BROMIDE 50 MG/5ML IV SOLN
INTRAVENOUS | Status: AC
  Filled 2015-09-18: qty 1

## 2015-09-18 MED ORDER — ONDANSETRON HCL 4 MG PO TABS: 4.0000 mg | ORAL_TABLET | ORAL | Status: DC | PRN

## 2015-09-18 MED ORDER — PROPOFOL 10 MG/ML IV BOLUS
INTRAVENOUS | Status: AC
  Filled 2015-09-18: qty 40

## 2015-09-18 MED ORDER — MIDAZOLAM HCL 2 MG/2ML IJ SOLN
INTRAMUSCULAR | Status: AC
  Administered 2015-09-18: 2 mg
  Filled 2015-09-18: qty 4

## 2015-09-18 MED ORDER — ARTIFICIAL TEARS OP OINT
TOPICAL_OINTMENT | OPHTHALMIC | Status: DC | PRN
  Administered 2015-09-18: 1 via OPHTHALMIC

## 2015-09-18 SURGICAL SUPPLY — 58 items
BAG DECANTER FOR FLEXI CONT (MISCELLANEOUS) ×3 IMPLANT
BUR SPIRAL ROUTER 2.3 (BUR) IMPLANT
BUR SPIRAL ROUTER 2.3MM (BUR)
CANISTER SUCT 3000ML PPV (MISCELLANEOUS) ×6 IMPLANT
CATH VENTRIC 35X38 W/TROCAR LG (CATHETERS) ×3 IMPLANT
CLIP TI MEDIUM 6 (CLIP) IMPLANT
DRAPE MICROSCOPE LEICA (MISCELLANEOUS) IMPLANT
DRAPE NEUROLOGICAL W/INCISE (DRAPES) ×3 IMPLANT
DRAPE SURG 17X23 STRL (DRAPES) IMPLANT
DRAPE WARM FLUID 44X44 (DRAPE) ×3 IMPLANT
DURAPREP 6ML APPLICATOR 50/CS (WOUND CARE) ×3 IMPLANT
ELECT CAUTERY BLADE 6.4 (BLADE) IMPLANT
ELECT REM PT RETURN 9FT ADLT (ELECTROSURGICAL) ×3
ELECTRODE REM PT RTRN 9FT ADLT (ELECTROSURGICAL) ×1 IMPLANT
EVACUATOR 1/8 PVC DRAIN (DRAIN) IMPLANT
GAUZE SPONGE 4X4 12PLY STRL (GAUZE/BANDAGES/DRESSINGS) ×3 IMPLANT
GAUZE SPONGE 4X4 16PLY XRAY LF (GAUZE/BANDAGES/DRESSINGS) IMPLANT
GLOVE BIO SURGEON STRL SZ8 (GLOVE) ×3 IMPLANT
GLOVE BIOGEL PI IND STRL 8 (GLOVE) ×2 IMPLANT
GLOVE BIOGEL PI INDICATOR 8 (GLOVE) ×4
GLOVE ECLIPSE 6.5 STRL STRAW (GLOVE) ×3 IMPLANT
GLOVE ECLIPSE 7.5 STRL STRAW (GLOVE) ×9 IMPLANT
GOWN STRL REUS W/ TWL LRG LVL3 (GOWN DISPOSABLE) ×1 IMPLANT
GOWN STRL REUS W/ TWL XL LVL3 (GOWN DISPOSABLE) ×1 IMPLANT
GOWN STRL REUS W/TWL 2XL LVL3 (GOWN DISPOSABLE) ×3 IMPLANT
GOWN STRL REUS W/TWL LRG LVL3 (GOWN DISPOSABLE) ×2
GOWN STRL REUS W/TWL XL LVL3 (GOWN DISPOSABLE) ×2
HEMOSTAT POWDER KIT SURGIFOAM (HEMOSTASIS) ×3 IMPLANT
KIT BASIN OR (CUSTOM PROCEDURE TRAY) ×3 IMPLANT
KIT DRAIN CSF ACCUDRAIN (MISCELLANEOUS) ×3 IMPLANT
KIT ROOM TURNOVER OR (KITS) ×3 IMPLANT
NEEDLE HYPO 22GX1.5 SAFETY (NEEDLE) ×3 IMPLANT
NS IRRIG 1000ML POUR BTL (IV SOLUTION) ×6 IMPLANT
PACK CRANIOTOMY (CUSTOM PROCEDURE TRAY) ×3 IMPLANT
PAD ARMBOARD 7.5X6 YLW CONV (MISCELLANEOUS) ×6 IMPLANT
PATTIES SURGICAL .25X.25 (GAUZE/BANDAGES/DRESSINGS) IMPLANT
PATTIES SURGICAL .5 X.5 (GAUZE/BANDAGES/DRESSINGS) IMPLANT
PATTIES SURGICAL .5 X3 (DISPOSABLE) IMPLANT
PATTIES SURGICAL 1X1 (DISPOSABLE) IMPLANT
PERFORATOR LRG  14-11MM (BIT)
PERFORATOR LRG 14-11MM (BIT) IMPLANT
PIN MAYFIELD SKULL DISP (PIN) IMPLANT
RUBBERBAND STERILE (MISCELLANEOUS) IMPLANT
SCREW SELF DRILL HT 1.5/4MM (Screw) ×9 IMPLANT
SPONGE NEURO XRAY DETECT 1X3 (DISPOSABLE) IMPLANT
SPONGE SURGIFOAM ABS GEL 100 (HEMOSTASIS) ×6 IMPLANT
STAPLER VISISTAT 35W (STAPLE) ×3 IMPLANT
SUT ETHILON 3 0 FSL (SUTURE) IMPLANT
SUT NURALON 4 0 TR CR/8 (SUTURE) ×6 IMPLANT
SUT VIC AB 2-0 CP2 18 (SUTURE) ×6 IMPLANT
SYR CONTROL 10ML LL (SYRINGE) IMPLANT
TAPE CLOTH SURG 4X10 WHT LF (GAUZE/BANDAGES/DRESSINGS) ×3 IMPLANT
TOWEL OR 17X24 6PK STRL BLUE (TOWEL DISPOSABLE) ×3 IMPLANT
TOWEL OR 17X26 10 PK STRL BLUE (TOWEL DISPOSABLE) ×3 IMPLANT
TRAY FOLEY CATH 16FRSI W/METER (SET/KITS/TRAYS/PACK) ×3 IMPLANT
TRAY FOLEY W/METER SILVER 14FR (SET/KITS/TRAYS/PACK) IMPLANT
UNDERPAD 30X30 INCONTINENT (UNDERPADS AND DIAPERS) IMPLANT
WATER STERILE IRR 1000ML POUR (IV SOLUTION) ×3 IMPLANT

## 2015-09-18 NOTE — Anesthesia Preprocedure Evaluation (Addendum)
Anesthesia Evaluation  Patient identified by MRN, date of birth, ID band Patient awake    Reviewed: Allergy & Precautions, NPO status , Patient's Chart, lab work & pertinent test results  Airway Mallampati: I  TM Distance: >3 FB Neck ROM: Full    Dental  (+) Dental Advisory Given   Pulmonary former smoker,    Pulmonary exam normal        Cardiovascular hypertension, Pt. on medications and Pt. on home beta blockers + CAD  Normal cardiovascular exam     Neuro/Psych    GI/Hepatic   Endo/Other  diabetes, Type 2, Oral Hypoglycemic Agents  Renal/GU      Musculoskeletal   Abdominal   Peds  Hematology   Anesthesia Other Findings   Reproductive/Obstetrics                            Anesthesia Physical Anesthesia Plan  ASA: III  Anesthesia Plan: General   Post-op Pain Management:    Induction: Intravenous  Airway Management Planned: Oral ETT  Additional Equipment:   Intra-op Plan:   Post-operative Plan: Possible Post-op intubation/ventilation  Informed Consent: I have reviewed the patients History and Physical, chart, labs and discussed the procedure including the risks, benefits and alternatives for the proposed anesthesia with the patient or authorized representative who has indicated his/her understanding and acceptance.   Dental advisory given  Plan Discussed with: CRNA and Surgeon  Anesthesia Plan Comments:        Anesthesia Quick Evaluation

## 2015-09-18 NOTE — Consult Note (Signed)
NEURO HOSPITALIST CONSULT NOTE   Requestig physician: Dr. Sherley Bounds, neurosurgery  Reason for Consult: To manage seizures  HPI:                                                                                                                                          Benjamin Schroeder is an 69 y.o. 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 This morning he was noted to have clinical seizures, associated with jaw tremor right facial twitching and right gaze deviation. An EEG was performed which showed frequent left frontoparietal epileptiform discharges with intermittent electrographic seizures associated with facial twitching as described. He was previously on Keppra 500 twice a day by mouth tablets. Loading dose of 500 mg IV Keppra was ordered by neurosurgery primary team this morning. Patient is unresponsive,  information obtained from the EMR and from nursing staff.   Past Medical History  Diagnosis Date  . Hypertension   . Diabetes mellitus without complication ([place])   . Blood transfusion without reported diagnosis   . Cirrhosis of liver ([place])   . Coronary artery disease   . Skin cancer     Past Surgical History  Procedure Laterality Date  . Coronary artery bypass graft    . Skin cancer excision    . Craniotomy Left 09/13/2015    Procedure: CRANIOTOMY HEMATOMA EVACUATION SUBDURAL;  Surgeon: Eustace Moore, MD;  Location: [place] [facility];  Service: Neurosurgery;  Laterality: Left;    History reviewed. No pertinent family history.   Social History:  reports that he has quit smoking. He does not have any smokeless tobacco history on file. He reports that he drinks alcohol. He reports that he does not use illicit drugs.  No Known Allergies  MEDICATIONS:                                                                                                                      Current  facility-administered medications:  .  0.9 % NaCl with KCl 20 mEq/ L  infusion, , Intravenous, Continuous, Eustace Moore, MD, Last Rate: 75 mL/hr at 09/18/15 1900 .  acetaminophen (TYLENOL) tablet 650 mg, 650 mg, Oral, Q4H PRN **OR** acetaminophen (TYLENOL) suppository 650  mg, 650 mg, Rectal, Q4H PRN, Eustace Moore, MD .  [COMPLETED] aminocaproic acid (AMICAR) 4 g in sodium chloride 0.9 % 50 mL infusion, 4 g, Intravenous, NOW, 4 g at 09/16/15 1538 **FOLLOWED BY** aminocaproic acid (AMICAR) 2 g in sodium chloride 0.9 % 50 mL infusion, 2 g, Intravenous, Q6H, Annia Belt, MD, 2 g at 09/18/15 1609 .  [START ON 09/19/2015] antiseptic oral rinse solution (CORINZ), 7 mL, Mouth Rinse, QID, Raylene Miyamoto, MD .  atenolol (TENORMIN) tablet 25 mg, 25 mg, Oral, Daily, Eustace Moore, MD, 25 mg at 09/17/15 1026 .  ceFAZolin (ANCEF) IVPB 1 g/50 mL premix, 1 g, Intravenous, Q8H, Eustace Moore, MD .  chlorhexidine gluconate (PERIDEX) 0.12 % solution 15 mL, 15 mL, Mouth Rinse, BID, Raylene Miyamoto, MD .  dexamethasone (DECADRON) injection 4 mg, 4 mg, Intravenous, Q6H, Eustace Moore, MD, 4 mg at 09/18/15 1610 .  feeding supplement (VITAL HIGH PROTEIN) liquid 1,000 mL, 1,000 mL, Per Tube, Q24H, [name], NP, 1,000 mL at 09/18/15 1800 .  fentaNYL (SUBLIMAZE) injection 50 mcg, 50 mcg, Intravenous, Q15 min PRN, [name], NP .  fentaNYL (SUBLIMAZE) injection 50 mcg, 50 mcg, Intravenous, Q2H PRN, [name], NP .  ferrous sulfate tablet 325 mg, 325 mg, Oral, Q breakfast, Eustace Moore, MD, 325 mg at 09/18/15 0740 .  insulin aspart (novoLOG) injection 0-15 Units, 0-15 Units, Subcutaneous, 6 times per day, [name], NP, 5 Units at 09/18/15 1644 .  labetalol (NORMODYNE,TRANDATE) injection 10-40 mg, 10-40 mg, Intravenous, Q10 min PRN, Eustace Moore, MD .  levETIRAcetam (KEPPRA) 1,000 mg in sodium chloride 0.9 % 100 mL IVPB, 1,000 mg, Intravenous, Q12H, Drayven Marchena Fuller Mandril, MD .  midazolam (VERSED) injection 1 mg, 1 mg, Intravenous, Q15 min PRN, [name], NP .  midazolam (VERSED) injection 1 mg, 1 mg, Intravenous, Q2H PRN, [name], NP .  ondansetron (ZOFRAN) tablet 4 mg, 4 mg, Oral, Q4H PRN **OR** ondansetron (ZOFRAN) injection 4 mg, 4 mg, Intravenous, Q4H PRN, Eustace Moore, MD .  pantoprazole (PROTONIX) injection 40 mg, 40 mg, Intravenous, QHS, Eustace Moore, MD .  phenytoin (DILANTIN) injection 100 mg, 100 mg, Intravenous, 3 times per day, Treston Coker Fuller Mandril, MD, 100 mg at 09/18/15 1813 .  phytonadione (VITAMIN K) tablet 5 mg, 5 mg, Oral, Daily, Annia Belt, MD, 5 mg at 09/17/15 1026 .  promethazine (PHENERGAN) tablet 12.5-25 mg, 12.5-25 mg, Oral, Q4H PRN, Eustace Moore, MD .  propofol (DIPRIVAN) 1000 MG/100ML infusion, 5-80 mcg/kg/min, Intravenous, Titrated, [name], MD, Last Rate: 10.1 mL/hr at 09/18/15 1900, 20 mcg/kg/min at 09/18/15 1900 .  propofol (DIPRIVAN) 1000 MG/100ML infusion, , , ,     ROS:  History obtained from unobtainable from patient due to mental status   Blood pressure 114/51, pulse 73, temperature 97.9 F (36.6 C), temperature source Axillary, resp. rate 14, height 6' (1.829 m), weight 83.8 kg (184 lb 11.9 oz), SpO2 100 %.   Neurologic Examination:                                                                                                      Limited neurological examination revealed right gaze preference, flattening of the right nasolabial fold noted. During seizures, right facial twitching, jaw tremor and right gaze deviation noted. No motor response to stimulation likely from sedation and postictal state. Brisk deep tendon reflexes in right upper and lower extremities compared to left side.     Lab Results: Basic Metabolic Panel:  Recent  Labs Lab 09/13/15 1255 09/16/15 1554  NA 137 130*  K 4.8 4.3  CL 106 102  CO2 25 23  GLUCOSE 229* 121*  BUN 24* 16  CREATININE 1.74* 1.18  CALCIUM 9.0 8.4*    Liver Function Tests:  Recent Labs Lab 09/16/15 1554  AST 28  ALT 22  ALKPHOS 63  BILITOT 1.8*  PROT 6.1*  ALBUMIN 2.8*   No results for input(s): LIPASE, AMYLASE in the last 168 hours. No results for input(s): AMMONIA in the last 168 hours.  CBC:  Recent Labs Lab 09/14/15 0645  09/15/15 0235 09/15/15 2200 09/16/15 1554 09/17/15 0356 09/18/15 0342  WBC 3.7*  < > 4.1 3.7* 3.2* 2.3* 2.5*  NEUTROABS 2.8  --   --  2.7 2.3 1.5* 1.6*  HGB 11.9*  < > 10.5* 10.0* 10.7* 9.4* 9.7*  HCT 34.7*  < > 30.4* 29.1* 31.0* 27.7* 28.0*  MCV 98.6  < > 98.1 97.0 96.3 96.2 95.9  PLT 34*  < > 34* 30* 47* 42* 82*  < > = values in this interval not displayed.  Cardiac Enzymes:  Recent Labs Lab 09/13/15 1255  TROPONINI <0.03    Lipid Panel: No results for input(s): CHOL, TRIG, HDL, CHOLHDL, VLDL, LDLCALC in the last 168 hours.  CBG:  Recent Labs Lab 09/17/15 1735 09/17/15 2133 09/18/15 0821 09/18/15 1429 09/18/15 1624  GLUCAP 103* 144* 97 179* 224*    Microbiology: Results for orders placed or performed during the hospital encounter of 09/13/15  MRSA PCR Screening     Status: None   Collection Time: 09/13/15  3:39 PM  Result Value Ref Range Status   MRSA by PCR NEGATIVE NEGATIVE Final    Comment:        The GeneXpert MRSA Assay (FDA approved for NASAL specimens only), is one component of a comprehensive MRSA colonization surveillance program. It is not intended to diagnose MRSA infection nor to guide or monitor treatment for MRSA infections.     Coagulation Studies:  Recent Labs  09/16/15 1554 09/18/15 0342  LABPROT 16.6* 15.3*  INR 1.33 1.20    Imaging: Ct Head Wo Contrast  09/18/2015  CLINICAL DATA:  Subdural hematoma follow-up EXAM: CT HEAD WITHOUT CONTRAST TECHNIQUE: Contiguous  axial images were obtained  from the base of the skull through the vertex without intravenous contrast. COMPARISON:  09/16/2015 FINDINGS: Unremarkable left parietal craniotomy site. A subdural drain has been removed, accounting for increase in pneumocephalus. Subdural hematoma along the left cerebral convexity has a stable size and distribution. Including hemostatic material below the bone flap, maximal thickness is 25 mm near the vertex. There is stable mass effect on the left cerebral hemisphere with 6-7 mm of midline shift. No herniation. No ACA ischemic change. No ventricular entrapment. IMPRESSION: Size stable left cerebral convexity subdural hematoma with 7 mm midline shift. Electronically Signed   By: Monte Fantasia M.D.   On: 09/18/2015 06:51   Portable Chest Xray  09/18/2015  CLINICAL DATA:  Hypertension, diabetes, subdural hematoma. EXAM: PORTABLE CHEST 1 VIEW COMPARISON:  September 13, 2015 FINDINGS: The heart size and mediastinal contours are stable. Patient status post prior CABG and median sternotomy. Endotracheal tube is identified distal tip 6.4 cm from carina. Nasogastric tube is identified distal tip not included on film but is at least in the stomach. Both lungs are clear. The visualized skeletal structures are stable. IMPRESSION: No active cardiopulmonary disease. Endotracheal tube and nasogastric tube as described. Electronically Signed   By: Abelardo Diesel M.D.   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 He was noted to have right facial twitches, jaw tremor and right gaze deviation which are associated with electrographic seizures on the EEG. I recommend stat IV phenytoin 1 g now. (Fosphenytoin is in short supply). For now we'll continue with the Keppra 500 twice a day. He had frequent brief clinical and electrographic seizures during my  evaluation which were aborted with one dose of 2 mg Ativan. If he continues to have seizures constraint was in the Keppra dose. He is planned for neurosurgical subdural hematoma evacuation this afternoon. EEG will be discontinued we'll perform another repeat EEG postoperatively late in the evening.   Addendum: Repeat EEG postoperatively, showed left hemispheric PLEDs without definite evidence of electrographic seizures. We'll continue phenytoin maintenance dose of 100 mg every 8 hours, and increase Keppra dose to 1 g twice a day. He is intubated and sedated with propofol. We'll plan repeat EEG tomorrow morning. Neurology service will continue to follow up. Please call for any further questions.

## 2015-09-18 NOTE — Transfer of Care (Signed)
Immediate Anesthesia Transfer of Care Note  Patient: Benjamin Schroeder  Procedure(s) Performed: Procedure(s): Craniotomy for evacuation of recurrent left subdural hematoma (Left)  Patient Location: PACU  Anesthesia Type:General  Level of Consciousness: obtunded, LOC same as pre-intubation  Airway & Oxygen Therapy: Patient Spontanous Breathing and Patient connected to face mask oxygen  Post-op Assessment: Report given to RN and Post -op Vital signs reviewed and stable  Post vital signs: Reviewed and stable  Last Vitals:  Filed Vitals:   09/18/15 1100 09/18/15 1200  BP: 150/48 148/61  Pulse: 68 81  Temp:    Resp: 15 17    Complications: No apparent anesthesia complications

## 2015-09-18 NOTE — Progress Notes (Signed)
EEG completed, results pending. 

## 2015-09-18 NOTE — Progress Notes (Signed)
eLink Physician-Brief Progress Note Patient Name: Benjamin Schroeder DOB: [birth year] MRN: [id number]   Date of Service  09/18/2015  HPI/Events of Note  PLEDS on EEG. Neurosurgery requests a Propofol IV infusion.  eICU Interventions  Will order a Propofol IV infusion. Titrate to RASS -2 to -3.     Intervention Category Major Interventions: Seizures - evaluation and management  Zandria Woldt [place] 09/18/2015, 5:34 PM

## 2015-09-18 NOTE — Progress Notes (Signed)
OT Cancellation Note    09/18/15 1200  OT Visit Information  Last OT Received On 09/18/15  Reason Eval/Treat Not Completed Patient at procedure or test/ unavailable  [facility], OTR/L  [id number] 09/18/2015

## 2015-09-18 NOTE — Progress Notes (Signed)
Routine EEG started, called neurohospitalist to view.  EEG changed to prolonged per Dr. Silverio Decamp

## 2015-09-18 NOTE — Op Note (Signed)
09/13/2015 - 09/18/2015  2:11 PM  PATIENT:  Benjamin Schroeder  69 y.o. male  PRE-OPERATIVE DIAGNOSIS:  Recurrent left subdural hematoma  POST-OPERATIVE DIAGNOSIS:  Same  PROCEDURE:  Redo left craniotomy for evacuation of left acute subdural hematoma  SURGEON:  Sherley Bounds, MD  ASSISTANTS: None  ANESTHESIA:   General  EBL: 100 ml  Total I/O In: 962.5 [I.V.:912.5; IV Piggyback:50] Out: [id number] [Urine:475; Blood:100]  BLOOD ADMINISTERED:none  DRAINS: Subdural drain   SPECIMEN:  No Specimen  INDICATION FOR PROCEDURE: This patient underwent a craniotomy for 5 days ago for a left acute on chronic subdural hematoma. His platelet count was 37,000. He developed a recurrent left subdural hematoma. He had progressively deteriorating course with increasing right-sided weakness, right facial focal seizures, and global aphasia. He was given Amicar and vitamin K and his platelets increased 82,000 today. This made it more reasonable to take him back to the operating room for re-evacuation of the subdural hemorrhage. Patient and family understood the risks, benefits, and alternatives and potential outcomes and wished to proceed.  PROCEDURE DETAILS: The patient was taken to the operating room and after induction of adequate generalized endotracheal anesthesia, the head was turned to the right to expose the left frontotemporal parietal region. The old staples were removed. The head was then cleaned with alcohol and then prepped with DuraPrep and draped in the usual sterile fashion. The old incision was opened and a self-retaining retractor was placed. The craniotomy flap was removed by removing the small screws holding the plate. The flap was then placed in bacitracin-containing saline solution, and the dura was opened to expose a fairly large recurrent left subdural hematoma. A hematoma was then removed with a combination of irrigation and suction. I continued to irrigate until the irrigant was clear to, and  dried any bleeding with bipolar cautery. I then placed a subdural drain through separate stab incision and close the dura with a running 4-0 Nurolon suture. Dural tack up sutures were placed. The dura was lined with Gelfoam, and the craniotomy flap was replaced with doggie-bone plates. The wound was copiously irrigated.   the galea was then closed with interrupted 2-0 Vicryl suture. The skin was then closed with staples a sterile dressing was applied. The patient was then  awakened from general anesthesia, and transported to the recovery room in guarded condition. At the end of the procedure all sponge, needle, and instrument counts were correct.   PLAN OF CARE: Admit to inpatient   PATIENT DISPOSITION:  ICU - extubated and stable.   Delay start of Pharmacological VTE agent (>24hrs) due to surgical blood loss or risk of bleeding:  yes

## 2015-09-18 NOTE — Consult Note (Signed)
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  To OR 11/25 for L craniotomy.  Did well post op until 11/30 when he developed global aphasia and R hemiparesis as well as ?seizure activity and went back to OR for redo crani.  Post op was extubated initially but had deteriorating mental status and respiratory failure and PCCM was consulted for intubation and vent management.   PAST MEDICAL HISTORY :  He  has a past medical history of Hypertension; Diabetes mellitus without complication ([place]); Blood transfusion without reported diagnosis; Cirrhosis of liver ([place]); Coronary artery disease; and Skin cancer.  PAST SURGICAL HISTORY: He  has past surgical history that includes Coronary artery bypass graft; Skin cancer excision; and Craniotomy (Left, 09/13/2015).  No Known Allergies  No current facility-administered medications on file prior to encounter.   No current outpatient prescriptions on file prior to encounter.    FAMILY HISTORY:  Unable -- pt sedated on vent.   SOCIAL HISTORY: He  reports that he has quit smoking. He does not have any smokeless tobacco history on file. He reports that he drinks alcohol. He reports that he does not use illicit drugs.  REVIEW OF SYSTEMS:   Unable, pt sedated on vent s/purgent intubation.   SUBJECTIVE:   VITAL SIGNS: BP 127/73 mmHg  Pulse 73  Temp(Src) 97.7 F (36.5 C) (Oral)  Resp 14  Ht 6' (1.829 m)  Wt 184 lb 11.9 oz (83.8 kg)  BMI 25.05 kg/m2  SpO2 100%  HEMODYNAMICS:    VENTILATOR SETTINGS:    INTAKE / OUTPUT: I/O last 3 completed shifts: In: 3040 [I.V.:2700; IV  Piggyback:340] Out: 2290 [Urine:2195; Drains:95]  PHYSICAL EXAMINATION: General:  Chronically ill appearing male, acutely ill  Neuro:  GCS7, localized to pain, PERL 59mm, sluggish, does not follow commands, not protecting airway  HEENT:  Mm moist, no JVD  Cardiovascular:  s1s2 rrr Lungs:  resps shallow, gurgling, coarse throughout  Abdomen:  Round, soft, +bs  Musculoskeletal:  Warm and dry, no edema   LABS:  CBC  Recent Labs Lab 09/16/15 1554 09/17/15 0356 09/18/15 0342  WBC 3.2* 2.3* 2.5*  HGB 10.7* 9.4* 9.7*  HCT 31.0* 27.7* 28.0*  PLT 47* 42* 82*   Coag's  Recent Labs Lab 09/13/15 1255 09/16/15 1554 09/18/15 0342  APTT 29  --   --   INR 1.19 1.33 1.20   BMET  Recent Labs Lab 09/13/15 1255 09/16/15 1554  NA 137 130*  K 4.8 4.3  CL 106 102  CO2 25 23  BUN 24* 16  CREATININE 1.74* 1.18  GLUCOSE 229* 121*   Electrolytes  Recent Labs Lab 09/13/15 1255 09/16/15 1554  CALCIUM 9.0 8.4*   Sepsis Markers No results for input(s): LATICACIDVEN, PROCALCITON, O2SATVEN in the last 168 hours. ABG No results for input(s): PHART, PCO2ART, PO2ART in the last 168 hours. Liver Enzymes  Recent Labs Lab 09/16/15 1554  AST 28  ALT 22  ALKPHOS 63  BILITOT 1.8*  ALBUMIN 2.8*   Cardiac Enzymes  Recent Labs Lab 09/13/15 1255  TROPONINI <0.03   Glucose  Recent Labs Lab 09/17/15 0740 09/17/15  1230 09/17/15 1735 09/17/15 2133 09/18/15 0821 09/18/15 1429  GLUCAP 74 114* 103* 144* 97 179*    Imaging Ct Head Wo Contrast  09/18/2015  CLINICAL DATA:  Subdural hematoma follow-up EXAM: CT HEAD WITHOUT CONTRAST TECHNIQUE: Contiguous axial images were obtained from the base of the skull through the vertex without intravenous contrast. COMPARISON:  09/16/2015 FINDINGS: Unremarkable left parietal craniotomy site. A subdural drain has been removed, accounting for increase in pneumocephalus. Subdural hematoma along the left cerebral convexity has a stable size  and distribution. Including hemostatic material below the bone flap, maximal thickness is 25 mm near the vertex. There is stable mass effect on the left cerebral hemisphere with 6-7 mm of midline shift. No herniation. No ACA ischemic change. No ventricular entrapment. IMPRESSION: Size stable left cerebral convexity subdural hematoma with 7 mm midline shift. Electronically Signed   By: Monte Fantasia M.D.   On: 09/18/2015 06:51     STUDIES:  CT head 11/25>>> acute on chronic moderate to large L SDH with 35mm left-right shift  CT head 11/30>>> Size stable left cerebral convexity subdural hematoma with 7 mm midline shift.   CULTURES: Sputum 11/30>>  ANTIBIOTICS: none  SIGNIFICANT EVENTS: 11/25 admitted, SDH, to OR  11/30 aphasia, R hemiparesis, ?seizure - back to OR for redo crani   LINES/TUBES: ETT 11/30>>>  DISCUSSION: 69 yo male with SDH in setting fall with chronic thrombocytopenia r/t cirrhosis.  S/p crani 11/25 with redo crani 11/30 after ?seizure activity and aphasia.   ASSESSMENT / PLAN:  NEUROLOGIC Large SDH - s/p crani 11/25 and redo 11/30 ?seziure  P:   RASS goal: -1 Fentanyl, versed PRN for sedation/seizure  Keppra, dilantin, decadron per nsgy  EEG pending    PULMONARY Acute respiratory failure  P:   Vent support - 8cc/kg  F/u CXR  F/u ABG  Sputum culture  Hold off on abx for now - see ID   CARDIOVASCULAR Hx HTN  CAD s/p CABG  P:  Hold ACE  Goal SBP 120  PRN labetalol  Continue atenolol   RENAL Hyponatremia  CKD  P:   F/u chem  Gentle NS   GASTROINTESTINAL ?hx Cirrhosis - ?NASH, no hx ETOH  P:   NPO  PPI  Start TF per dietitian   HEMATOLOGIC Thrombocytopenia - chronic  SDH Anemia  P:  SCD's  F/u CBC   INFECTIOUS No active issue  P:   Sputum culture with significant secretions  Trend wbc, fever curve  Low threshold to add abx for ?aspiration if spikes fever   ENDOCRINE DM P:   SSI   FAMILY  - Updates: Sons updated at  length at bedside 11/30    Nickolas Madrid, NP 09/18/2015  4:16 PM Pager: [phone] or [phone]   STAFF NOTE: Linwood Dibbles, MD FACP have personally reviewed patient's available data, including medical history, events of note, physical examination and test results as part of my evaluation. I have discussed with resident/NP and other care providers such as pharmacist, RN and RRT. In addition, I personally evaluated patient and elicited key findings of: GCS 7 at best post extubation in PACU, upper airway sounds and ronchi, he is NOT protecting his airway and requires re intubation, to 7 cc/kg, he is tall, likely 620, rate 14, 100%, peep 5, repeat abg and pcxr post intubation, I have concerns for subclinical seizure activity and want to repeat eeg, will defer to NS for CT re imaging timing, send sputum, low threshold to  add zosyn for aspiration, although secretions appear clear on cords, follow plat count closely and drain output, add ppi, i have updated his sons at the bedside of clinical changes and intubation needs and expectations The patient is critically ill with multiple organ systems failure and requires high complexity decision making for assessment and support, frequent evaluation and titration of therapies, application of advanced monitoring technologies and extensive interpretation of multiple databases.   Critical Care Time devoted to patient care services described in this note is 45  Minutes. This time reflects time of care of this signee: Merrie Roof, MD FACP. This critical care time does not reflect procedure time, or teaching time or supervisory time of PA/NP/Med student/Med Resident etc but could involve care discussion time. Rest per NP/medical resident whose note is outlined above and that I agree with   Lavon Paganini. Titus Mould, MD, [place] Pgr: [facility] 09/18/2015 8:03 PM

## 2015-09-18 NOTE — Progress Notes (Signed)
Patient ID: Benjamin Schroeder, male   DOB: [birth year], 69 y.o.   MRN: [id number] Subjective: Patient remains globally aphasic and right hemiparetic. He has started to have right focal facial seizures but his eyes do deviate to the right. They last for about 30 seconds. He is somewhat weaker on the right today and that may be related to the seizures. His head CT shows stable left subdural hematoma with mass effect and shift. Platelets are up to 82,000 today. He still arouses easily and regards the examiner. He protects his airway.  Objective: Vital signs in last 24 hours: Temp:  [98.5 F (36.9 C)-99.7 F (37.6 C)] 98.5 F (36.9 C) (11/30 0823) Pulse Rate:  [65-77] 74 (11/30 0700) Resp:  [13-19] 14 (11/30 0700) BP: (108-155)/(44-78) 131/56 mmHg (11/30 0700) SpO2:  [94 %-99 %] 95 % (11/30 0700)  Intake/Output from previous day: 11/29 0701 - 11/30 0700 In: 2182 [I.V.:1950; IV Piggyback:232] Out: 1385 [id number]; Drains:15] Intake/Output this shift:    As above  Lab Results: Lab Results  Component Value Date   WBC 2.5* 09/18/2015   HGB 9.7* 09/18/2015   HCT 28.0* 09/18/2015   MCV 95.9 09/18/2015   PLT 82* 09/18/2015   Lab Results  Component Value Date   INR 1.20 09/18/2015   BMET Lab Results  Component Value Date   NA 130* 09/16/2015   K 4.3 09/16/2015   CL 102 09/16/2015   CO2 23 09/16/2015   GLUCOSE 121* 09/16/2015   BUN 16 09/16/2015   CREATININE 1.18 09/16/2015   CALCIUM 8.4* 09/16/2015    Studies/Results: Ct Head Wo Contrast  09/18/2015  CLINICAL DATA:  Subdural hematoma follow-up EXAM: CT HEAD WITHOUT CONTRAST TECHNIQUE: Contiguous axial images were obtained from the base of the skull through the vertex without intravenous contrast. COMPARISON:  09/16/2015 FINDINGS: Unremarkable left parietal craniotomy site. A subdural drain has been removed, accounting for increase in pneumocephalus. Subdural hematoma along the left cerebral convexity has a stable size and  distribution. Including hemostatic material below the bone flap, maximal thickness is 25 mm near the vertex. There is stable mass effect on the left cerebral hemisphere with 6-7 mm of midline shift. No herniation. No ACA ischemic change. No ventricular entrapment. IMPRESSION: Size stable left cerebral convexity subdural hematoma with 7 mm midline shift. Electronically Signed   By: Monte Fantasia M.D.   On: 09/18/2015 06:51    Assessment/Plan: Given the above (continued mass effect and shift with the subdural hematoma, new focal facial seizures, right hemiparesis with global aphasia, significantly improved platelet count ) I have recommended a redo left craniotomy for evacuation of the subdural blood products. He do not believe this will lead to the immediate improvement but likely will reduce the length of time it will take discharge plan to improve if we allow this to simply resolve on its own. The improvement and platelet count reduces the risk of redo surgery and therefore now I think the potential benefits probably outweighed the potential risks. I have explained this to the son in detail. He has given [place] permission to proceed. He understands the risk of the surgery include but are not limited to bleeding, infection, brain injury, stroke, numbness, weakness, loss of vision, and anesthesia risk including DVT pneumonia MI and death.   LOS: 5 days    Maher Shon S 09/18/2015, 8:30 AM

## 2015-09-18 NOTE — Anesthesia Postprocedure Evaluation (Signed)
Anesthesia Post Note  Patient: Benjamin Schroeder  Procedure(s) Performed: Procedure(s) (LRB): Craniotomy for evacuation of recurrent left subdural hematoma (Left)  Patient location during evaluation: PACU Anesthesia Type: General Level of consciousness: awake and alert Pain management: pain level controlled Vital Signs Assessment: post-procedure vital signs reviewed and stable Respiratory status: spontaneous breathing, nonlabored ventilation, respiratory function stable and patient connected to nasal cannula oxygen Cardiovascular status: blood pressure returned to baseline and stable Postop Assessment: no signs of nausea or vomiting Anesthetic complications: no    Last Vitals:  Filed Vitals:   09/18/15 1500 09/18/15 1515  BP: 134/53 128/62  Pulse: 77 73  Temp:    Resp: 14 14    Last Pain:  Filed Vitals:   09/18/15 1518  PainSc: 0-No pain    LLE Motor Response: Purposeful movement   RLE Motor Response: Non-purposeful movement, Other (Comment) (moves to pain)        Chee Dimon DAVID

## 2015-09-18 NOTE — Procedures (Signed)
Intubation Procedure Note Narender Peduzzi [id number] [birth year]  Procedure: Intubation Indications: Respiratory insufficiency  Procedure Details Consent: Unable to obtain consent because of emergent medical necessity. Time Out: Verified patient identification, verified procedure, site/side was marked, verified correct patient position, special equipment/implants available, medications/allergies/relevent history reviewed, required imaging and test results available.  Performed  Maximum sterile technique was used including antiseptics, cap, gloves, gown, hand hygiene, mask and sheet.  MAC and 4    Evaluation Hemodynamic Status: BP stable throughout; O2 sats: stable throughout Patient's Current Condition: stable Complications: No apparent complications Patient did tolerate procedure well. Chest X-ray ordered to verify placement.  CXR: pending.   Raylene Miyamoto 09/18/2015  Glide Copious clear secretions in and throught cords  I DID THIS PROCEDURE BY MYSELF!!!  Lavon Paganini. Titus Mould, MD, [place] Pgr: [facility]

## 2015-09-18 NOTE — Progress Notes (Signed)
Pt having focal seizures to R side of face with a right gaze. (x2) Lasting about 30 seconds each. Notified Dr. Ronnald Ramp. Waiting a new IV via IV team so Keppra can be given. Will continue to monitor pt.

## 2015-09-18 NOTE — Progress Notes (Signed)
Pt returning to OR today for redo Lt craniotomy.  Will continue to follow postop for discharge planning needs.    Reinaldo Raddle, RN, BSN  Trauma/Neuro ICU Case Manager [phone]

## 2015-09-18 NOTE — Progress Notes (Signed)
Speech Pathology: Canceled treatment - pt in OR for redo crani.  F/u next date.  Askari Kinley L. Tivis Ringer, [place] CCC/SLP Pager [phone]

## 2015-09-18 NOTE — Progress Notes (Signed)
   09/18/15 2047  Clinical Encounter Type  Visited With Family;Health care provider  Visit Type Initial;Psychological support;Social support;Critical Care  Referral From Nurse  Spiritual Encounters  Spiritual Needs Emotional  Stress Factors  Patient Stress Factors None identified  Family Stress Factors Major life changes;Loss of control;Lack of knowledge;Exhausted   Chaplain responded to nurse request to assist pt children with overnight accommodations.  Hospitality options are not available after 5pm, however, Chaplain researched various hotel options and provided information about the hospitality accommodations which can be made available after 8:30 am on Thursday.  Chaplain is available for further assistance as needed.  [employer], Chaplain

## 2015-09-18 NOTE — Procedures (Signed)
ELECTROENCEPHALOGRAM REPORT   Patient: Benjamin Schroeder       Room #: 16m-04 Age: 69 y.o.        Sex: male Referring Physician: Dr Ronnald Ramp Report Date:  09/18/2015        Interpreting Physician: Hulen Luster  History: [name] is an 69 y.o. male admitted with left SDH with noted focal seizures  Medications:  Continuous: . 0.9 % NaCl with KCl 20 mEq / L 75 mL/hr at 09/18/15 1800  . propofol (DIPRIVAN) infusion 20 mcg/kg/min (09/18/15 1800)    Conditions of Recording:  This is a 16 channel EEG carried out with the patient in the intubated and sedated state.   Description:  The background activity is markedly suppressed with no noted posterior dominant alpha rhythm. Throughout the recording there is periodic sharp wave activity occuring on the left side at a frequency of 0.5 to 1 Hz. No clear evolution to sustained epileptiform activity is noted.   Hyperventilation was not performed. Intermittent photic stimulation was performed but failed to illicit any change in the tracing.    IMPRESSION: Abnormal EEG due to the following:  1)Suppression of the background rhythm, likely a medication effect 2) Periodic sharp wave activity on the left side consistent with PLEDs   Jim Like, DO [facility] [phone]  If 7pm- 7am, please page neurology on call as listed in [facility]ION. 09/18/2015, 6:38 PM

## 2015-09-18 NOTE — Progress Notes (Signed)
Patient ID: Benjamin Schroeder, male   DOB: [birth year], 69 y.o.   MRN: [id number] Hematology: Patient taken back to surgery for 2nd drainage procedure in view of clinical deterioration. Platelets 80,000 today. Last transfused on 11/28. Significant rise reflects random donor effect with serenditpitously better HLA match. I will alert blood bank to flag this donor for future transfusions. I will continue AMICAR. Platelets as need to keep count above 50,000 next few days if possible. In retrospect, low WBC likely a chronic finding related to cirrhosis/pancytopenia.  Murriel Hopper, MD, [place]  Hematology-Oncology/Internal Medicine [phone]

## 2015-09-18 NOTE — Anesthesia Procedure Notes (Addendum)
Procedure Name: Intubation Date/Time: 09/18/2015 12:57 PM Performed by: Garrison [place] T Pre-anesthesia Checklist: Patient identified, Emergency Drugs available, Suction available, Patient being monitored and Timeout performed Patient Re-evaluated:Patient Re-evaluated prior to inductionOxygen Delivery Method: Circle system utilized Preoxygenation: Pre-oxygenation with 100% oxygen Intubation Type: IV induction Ventilation: Mask ventilation without difficulty Laryngoscope Size: Mac and 4 Grade View: Grade II Tube type: Subglottic suction tube Tube size: 8.0 mm Number of attempts: 1 Placement Confirmation: ETT inserted through vocal cords under direct vision,  positive ETCO2,  CO2 detector and breath sounds checked- equal and bilateral Secured at: 22 cm Tube secured with: Tape Dental Injury: Teeth and Oropharynx as per pre-operative assessment

## 2015-09-19 ENCOUNTER — Inpatient Hospital Stay (HOSPITAL_COMMUNITY): Payer: Medicare Other

## 2015-09-19 ENCOUNTER — Encounter (HOSPITAL_COMMUNITY): Payer: Self-pay | Admitting: Radiology

## 2015-09-19 DIAGNOSIS — J9601 Acute respiratory failure with hypoxia: Secondary | ICD-10-CM

## 2015-09-19 DIAGNOSIS — J9690 Respiratory failure, unspecified, unspecified whether with hypoxia or hypercapnia: Secondary | ICD-10-CM | POA: Insufficient documentation

## 2015-09-19 DIAGNOSIS — R569 Unspecified convulsions: Secondary | ICD-10-CM | POA: Insufficient documentation

## 2015-09-19 LAB — CBC WITH DIFFERENTIAL/PLATELET
BASOS ABS: 0 10*3/uL (ref 0.0–0.1)
BASOS PCT: 0 %
Eosinophils Absolute: 0 10*3/uL (ref 0.0–0.7)
Eosinophils Relative: 0 %
HEMATOCRIT: 25.9 % — AB (ref 39.0–52.0)
HEMOGLOBIN: 9 g/dL — AB (ref 13.0–17.0)
LYMPHS PCT: 13 %
Lymphs Abs: 0.3 10*3/uL — ABNORMAL LOW (ref 0.7–4.0)
MCH: 33.5 pg (ref 26.0–34.0)
MCHC: 34.7 g/dL (ref 30.0–36.0)
MCV: 96.3 fL (ref 78.0–100.0)
MONO ABS: 0.2 10*3/uL (ref 0.1–1.0)
Monocytes Relative: 8 %
NEUTROS ABS: 1.9 10*3/uL (ref 1.7–7.7)
NEUTROS PCT: 79 %
Platelets: 49 10*3/uL — ABNORMAL LOW (ref 150–400)
RBC: 2.69 MIL/uL — AB (ref 4.22–5.81)
RDW: 12.1 % (ref 11.5–15.5)
WBC: 2.4 10*3/uL — AB (ref 4.0–10.5)

## 2015-09-19 LAB — BASIC METABOLIC PANEL
ANION GAP: 8 (ref 5–15)
BUN: 27 mg/dL — ABNORMAL HIGH (ref 6–20)
CALCIUM: 8.3 mg/dL — AB (ref 8.9–10.3)
CO2: 19 mmol/L — AB (ref 22–32)
Chloride: 108 mmol/L (ref 101–111)
Creatinine, Ser: 1.33 mg/dL — ABNORMAL HIGH (ref 0.61–1.24)
GFR calc non Af Amer: 53 mL/min — ABNORMAL LOW (ref >60)
Glucose, Bld: 236 mg/dL — ABNORMAL HIGH (ref 65–99)
POTASSIUM: 4.7 mmol/L (ref 3.5–5.1)
Sodium: 135 mmol/L (ref 135–145)

## 2015-09-19 LAB — GLUCOSE, CAPILLARY
GLUCOSE-CAPILLARY: 147 mg/dL — AB (ref 65–99)
GLUCOSE-CAPILLARY: 187 mg/dL — AB (ref 65–99)
GLUCOSE-CAPILLARY: 224 mg/dL — AB (ref 65–99)
Glucose-Capillary: 160 mg/dL — ABNORMAL HIGH (ref 65–99)
Glucose-Capillary: 198 mg/dL — ABNORMAL HIGH (ref 65–99)
Glucose-Capillary: 200 mg/dL — ABNORMAL HIGH (ref 65–99)
Glucose-Capillary: 206 mg/dL — ABNORMAL HIGH (ref 65–99)

## 2015-09-19 LAB — MAGNESIUM: Magnesium: 1.8 mg/dL (ref 1.7–2.4)

## 2015-09-19 MED ORDER — VITAL AF 1.2 CAL PO LIQD
1000.0000 mL | ORAL | Status: DC
  Administered 2015-09-19 – 2015-09-21 (×2): 1000 mL
  Filled 2015-09-19 (×6): qty 1000

## 2015-09-19 MED ORDER — PRO-STAT SUGAR FREE PO LIQD
30.0000 mL | Freq: Three times a day (TID) | ORAL | Status: DC
  Administered 2015-09-19 – 2015-09-23 (×9): 30 mL
  Filled 2015-09-19 (×9): qty 30

## 2015-09-19 MED ORDER — CHLORHEXIDINE GLUCONATE 0.12% ORAL RINSE (MEDLINE KIT)
15.0000 mL | Freq: Two times a day (BID) | OROMUCOSAL | Status: DC
  Administered 2015-09-19 – 2015-09-28 (×17): 15 mL via OROMUCOSAL

## 2015-09-19 MED ORDER — ANTISEPTIC ORAL RINSE SOLUTION (CORINZ)
7.0000 mL | OROMUCOSAL | Status: DC
  Administered 2015-09-19 – 2015-09-23 (×34): 7 mL via OROMUCOSAL

## 2015-09-19 MED ORDER — SODIUM CHLORIDE 0.9 % IV SOLN
Freq: Once | INTRAVENOUS | Status: AC
  Administered 2015-09-21: 07:00:00 via INTRAVENOUS

## 2015-09-19 NOTE — Progress Notes (Signed)
Bedside EEG completed, results pending. 

## 2015-09-19 NOTE — Progress Notes (Signed)
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 He is also on Keppra 1 g twice a day. Patient sedated intubated, on propofol. No further clinical seizures today , per nursing staff .  several family members were present at bedside.    Current facility-administered medications:  .  0.9 %  sodium chloride infusion, , Intravenous, Once, Annia Belt, MD .  0.9 % NaCl with KCl 20 mEq/ L  infusion, , Intravenous, Continuous, Eustace Moore, MD, Last Rate: 75 mL/hr at 09/19/15 2100 .  acetaminophen (TYLENOL) tablet 650 mg, 650 mg, Oral, Q4H PRN **OR** acetaminophen (TYLENOL) suppository 650 mg, 650 mg, Rectal, Q4H PRN, Eustace Moore, MD .  [COMPLETED] aminocaproic acid (AMICAR) 4 g in sodium chloride 0.9 % 50 mL infusion, 4 g, Intravenous, NOW, 4 g at 09/16/15 1538 **FOLLOWED BY** aminocaproic acid (AMICAR) 2 g in sodium chloride 0.9 % 50 mL infusion, 2 g, Intravenous, Q6H, Annia Belt, MD, 2 g at 09/19/15 1731 .  antiseptic oral rinse solution (CORINZ), 7 mL, Mouth Rinse, 10 times per day, Eustace Moore, MD .  atenolol (TENORMIN) tablet 25 mg, 25 mg, Oral, Daily, Eustace Moore, MD, 25 mg at 09/19/15 1001 .  chlorhexidine gluconate (PERIDEX) 0.12 % solution 15 mL, 15 mL, Mouth Rinse, BID, Eustace Moore, MD .  feeding supplement (PRO-STAT SUGAR FREE 64) liquid 30 mL, 30 mL, Per Tube, TID, [name], RD, 30 mL at 09/19/15 1731 .  feeding supplement (VITAL AF 1.2 CAL) liquid 1,000 mL, 1,000 mL, Per Tube, Continuous, [name], RD, Last Rate: 45 mL/hr at 09/19/15 2100, 1,000 mL at 09/19/15 2100 .  fentaNYL (SUBLIMAZE) injection 50 mcg, 50 mcg, Intravenous, Q15 min PRN, [name], NP .  fentaNYL (SUBLIMAZE) injection 50 mcg, 50  mcg, Intravenous, Q2H PRN, [name], NP .  ferrous sulfate tablet 325 mg, 325 mg, Oral, Q breakfast, Eustace Moore, MD, 325 mg at 09/19/15 [id number] .  insulin aspart (novoLOG) injection 0-15 Units, 0-15 Units, Subcutaneous, 6 times per day, [name], NP, 3 Units at 09/19/15 2049 .  labetalol (NORMODYNE,TRANDATE) injection 10-40 mg, 10-40 mg, Intravenous, Q10 min PRN, Eustace Moore, MD .  levETIRAcetam (KEPPRA) 1,000 mg in sodium chloride 0.9 % 100 mL IVPB, 1,000 mg, Intravenous, Q12H, Ashdon Gillson Fuller Mandril, MD, 1,000 mg at 09/19/15 0959 .  midazolam (VERSED) injection 1 mg, 1 mg, Intravenous, Q15 min PRN, [name], NP .  midazolam (VERSED) injection 1 mg, 1 mg, Intravenous, Q2H PRN, [name], NP .  ondansetron (ZOFRAN) tablet 4 mg, 4 mg, Oral, Q4H PRN **OR** ondansetron (ZOFRAN) injection 4 mg, 4 mg, Intravenous, Q4H PRN, Eustace Moore, MD .  pantoprazole (PROTONIX) injection 40 mg, 40 mg, Intravenous, QHS, Eustace Moore, MD, 40 mg at 09/18/15 2115 .  phenytoin (DILANTIN) injection 100 mg, 100 mg, Intravenous, 3 times per day, Asiel Chrostowski Fuller Mandril, MD, 100 mg at 09/19/15 1446 .  phytonadione (VITAMIN K) tablet 5 mg, 5 mg, Oral, Daily, Annia Belt, MD, 5 mg at 09/19/15 1001 .  promethazine (PHENERGAN) tablet 12.5-25 mg, 12.5-25 mg, Oral, Q4H PRN, Eustace Moore, MD .  propofol (DIPRIVAN) 1000 MG/100ML infusion, 5-80 mcg/kg/min, Intravenous, Titrated, [name], MD, Last Rate:  10.1 mL/hr at 09/19/15 2100, 20 mcg/kg/min at 09/19/15 2100  Exam: Filed Vitals:   09/19/15 1700 09/19/15 1800  BP: 135/49 141/57  Pulse: 58 95  Temp:    Resp: 13 14    Patient intubated, sedated. Midline gaze pupils equal reactive. Facial creases appears symmetric. No motor movement destination secondary to sedation.   When the propofol was briefly turned off this morning, notes reported that he was able to move bilateral upper extremities  spontaneously.     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 He is status post craniectomy for subdural hematoma evacuation yesterday. Intubated in ICU, sedated with propofol. He did not have any further clinical seizures. Recommendations: Continue the same dose of Dilantin  100 mg every 8 hours IV and Keppra IV 1 g twice a day , as he does not have any further clinicals seizures, with resolution of abnormal epileptiform discharges noted on the EEG today. Goal is to wean propofol as tolerated to work towards extubation. His neurological assessment and treatment are discussed in detail with the family members present at bedside, answered several of their questions.   Neurology service will continue to follow up. Please call for any further questions.

## 2015-09-19 NOTE — Progress Notes (Signed)
Took patient to CT this am. Successful trip.

## 2015-09-19 NOTE — Progress Notes (Signed)
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  To OR 11/25 for L craniotomy.  Did well post op until 11/30 when he developed global aphasia and R hemiparesis as well as ?seizure activity and went back to OR for redo crani.  Post op was extubated initially but had deteriorating mental status and respiratory failure and PCCM was consulted for intubation and vent management.   SUBJECTIVE: No events overnight, remains on propofol.  VITAL SIGNS: BP 136/52 mmHg  Pulse 68  Temp(Src) 98.3 F (36.8 C) (Axillary)  Resp 13  Ht 6' (1.829 m)  Wt 85.5 kg (188 lb 7.9 oz)  BMI 25.56 kg/m2  SpO2 100%  HEMODYNAMICS:    VENTILATOR SETTINGS: Vent Mode:  [-] PRVC FiO2 (%):  [50 %-100 %] 60 % Set Rate:  [14 bmp] 14 bmp Vt Set:  [620 mL] 620 mL PEEP:  [5 cmH20] 5 cmH20 Plateau Pressure:  [12 cmH20-20 cmH20] 12 cmH20  INTAKE / OUTPUT: I/O last 3 completed shifts: In: 4148.3 [I.V.:3140.3; NG/GT:400; IV [id number] Out: 2006 [Urine:1880; Drains:26; Blood:100]  PHYSICAL EXAMINATION: General:  Chronically ill appearing male, acutely ill, sedate  Neuro:  Sedated and intubated, withdraws to pain on the right weak on left. HEENT:  Mm moist, no JVD  Cardiovascular:  s1s2 rrr Lungs:  Coarse BS diffusely. Abdomen:  Round, soft, +bs  Musculoskeletal:  Warm and dry, no edema   LABS:  CBC  Recent Labs Lab 09/17/15 0356 09/18/15 0342 09/19/15 0419  WBC 2.3* 2.5* 2.4*  HGB 9.4* 9.7* 9.0*  HCT 27.7* 28.0* 25.9*  PLT 42* 82* 49*   Coag's  Recent Labs Lab 09/13/15 1255 09/16/15 1554 09/18/15 0342  APTT 29  --   --   INR  1.19 1.33 1.20   BMET  Recent Labs Lab 09/13/15 1255 09/16/15 1554 09/19/15 0419  NA 137 130* 135  K 4.8 4.3 4.7  CL 106 102 108  CO2 25 23 19*  BUN 24* 16 27*  CREATININE 1.74* 1.18 1.33*  GLUCOSE 229* 121* 236*   Electrolytes  Recent Labs Lab 09/13/15 1255 09/16/15 1554 09/19/15 0419  CALCIUM 9.0 8.4* 8.3*  MG  --   --  1.8   Sepsis Markers No results for input(s): LATICACIDVEN, PROCALCITON, O2SATVEN in the last 168 hours. ABG  Recent Labs Lab 09/18/15 1725  PHART 7.417  PCO2ART 27.4*  PO2ART 501*   Liver Enzymes  Recent Labs Lab 09/16/15 1554 09/18/15 1831  AST 28 33  ALT 22 35  ALKPHOS 63  --   BILITOT 1.8*  --   ALBUMIN 2.8*  --    Cardiac Enzymes  Recent Labs Lab 09/13/15 1255  TROPONINI <0.03   Glucose  Recent Labs Lab 09/18/15 1429 09/18/15 1624 09/18/15 2037 09/18/15 2359 09/19/15 0435 09/19/15 0758  GLUCAP 179* 224* 200* 206* 224* 200*   Imaging Ct Head Wo Contrast  09/19/2015  CLINICAL DATA:  Follow-up subdural hematoma. EXAM: CT HEAD WITHOUT CONTRAST TECHNIQUE: Contiguous axial images were obtained from the base of the skull through the vertex without intravenous contrast. COMPARISON:  Prior study from 09/18/2015 as well as multiple  previous exams. FINDINGS: Left parietal craniotomy site is stable in appearance with overlying skin staples. There has been placement of a small bore drain into the left subdural space. Pneumocephalus is slightly decreased. Hemostatic material subjacent to the craniotomy bone flap.Subdural hematoma along the left cerebral convexity measures up to 17 mm, decreased from prior. Persistent mass effect on the left cerebral hemisphere with 4.5 mm of left-to-right shift, improved. No hydrocephalus. No ventricular entrapment. Basilar cisterns remain patent. No evidence for acute large vessel ischemia. Paranasal sinuses and mastoids are stable. No acute abnormality about the orbits. IMPRESSION: Decreased size of  left subdural hematoma with improved 4-5 mm of left-to-right midline shift. Electronically Signed   By: Jeannine Boga M.D.   On: 09/19/2015 05:51   Portable Chest Xray  09/19/2015  CLINICAL DATA:  Several hematoma, diabetes, hepatic cirrhosis, intubated patient. EXAM: PORTABLE CHEST 1 VIEW COMPARISON:  Portable chest x-ray of September 18, 2015 FINDINGS: The lungs are adequately inflated. There is no focal infiltrate. The interstitial markings at both lung bases are slightly more prominent today. The heart is normal in size. The pulmonary vascularity is normal. The mediastinum is normal in width. The patient has undergone previous median sternotomy. The endotracheal tube tip lies the esophagogastric tube tip projects below the inferior margin of the image. The proximal port is at the GE junction. The bony thorax exhibits no acute abnormalities. Approximately 4.3 cm above the carina. IMPRESSION: Slight interval increase in interstitial density at both lung bases consistent with subsegmental atelectasis. Advancement of the nasogastric tube by 5-10 cm recommended to assure that the proximal port is positioned below the GE junction. Electronically Signed   By: David  Martinique M.D.   On: 09/19/2015 07:18   Portable Chest Xray  09/18/2015  CLINICAL DATA:  Hypertension, diabetes, subdural hematoma. EXAM: PORTABLE CHEST 1 VIEW COMPARISON:  September 13, 2015 FINDINGS: The heart size and mediastinal contours are stable. Patient status post prior CABG and median sternotomy. Endotracheal tube is identified distal tip 6.4 cm from carina. Nasogastric tube is identified distal tip not included on film but is at least in the stomach. Both lungs are clear. The visualized skeletal structures are stable. IMPRESSION: No active cardiopulmonary disease. Endotracheal tube and nasogastric tube as described. Electronically Signed   By: Abelardo Diesel M.D.   On: 09/18/2015 17:52   STUDIES:  CT head 11/25>>> acute on chronic  moderate to large L SDH with 8mm left-right shift  CT head 11/30>>> Size stable left cerebral convexity subdural hematoma with 7 mm midline shift.  CULTURES: Sputum 11/30>>  ANTIBIOTICS: None  SIGNIFICANT EVENTS: 11/25 admitted, SDH, to OR  11/30 aphasia, R hemiparesis, ?seizure - back to OR for redo crani   LINES/TUBES: ETT 11/30>>>  DISCUSSION: 69 yo male with SDH in setting fall with chronic thrombocytopenia r/t cirrhosis.  S/p crani 11/25 with redo crani 11/30 after ?seizure activity and aphasia.   ASSESSMENT / PLAN:  NEUROLOGIC Large SDH - s/p crani 11/25 and redo 11/30 ?seziure  P:   RASS goal: -1 Fentanyl, versed PRN for sedation/seizure  Keppra, dilantin, decadron per nsgy  EEG per neuro  PULMONARY Acute respiratory failure  P:   Vent support - 8cc/kg  Sputum culture  Hold off on abx for now - see ID  ABG and adjust vent for ABG  CARDIOVASCULAR Hx HTN  CAD s/p CABG  P:  Hold ACE  Goal SBP 120  PRN labetalol  Continue atenolol   RENAL Hyponatremia  CKD  P:  F/u chem  Gentle NS   GASTROINTESTINAL ?hx Cirrhosis - ?NASH, no hx ETOH  P:   TF per nutrition PPI   HEMATOLOGIC Thrombocytopenia - chronic  SDH Anemia  P:  SCD's  F/u CBC   INFECTIOUS No active issue  P:   Sputum culture with significant secretions  Trend wbc, fever curve  Low threshold to add abx for ?aspiration if spikes fever   ENDOCRINE DM P:   SSI  FAMILY  - Updates: Sons updated at length at bedside 12/1.  The patient is critically ill with multiple organ systems failure and requires high complexity decision making for assessment and support, frequent evaluation and titration of therapies, application of advanced monitoring technologies and extensive interpretation of multiple databases.   Critical Care Time devoted to patient care services described in this note is  35  Minutes. This time reflects time of care of this signee Dr Jennet Maduro. This critical care  time does not reflect procedure time, or teaching time or supervisory time of PA/NP/Med student/Med Resident etc but could involve care discussion time.  Rush Farmer, M.D. [facility] Pulmonary/Critical Care Medicine. Pager: [phone]. After hours pager: [phone].

## 2015-09-19 NOTE — Progress Notes (Signed)
Patient ID: Benjamin Schroeder, male   DOB: 08-23-1946, 69 y.o.   MRN: [id number] Subjective: Patient remains sedated on propofol.  Objective: Vital signs in last 24 hours: Temp:  [96.6 F (35.9 C)-98.8 F (37.1 C)] 98.8 F (37.1 C) (12/01 0439) Pulse Rate:  [64-81] 66 (12/01 0700) Resp:  [12-25] 14 (12/01 0700) BP: (86-150)/(44-73) 117/50 mmHg (12/01 0700) SpO2:  [92 %-100 %] 100 % (12/01 0700) FiO2 (%):  [60 %-100 %] 60 % (12/01 0400) Weight:  [85.5 kg (188 lb 7.9 oz)] 85.5 kg (188 lb 7.9 oz) (12/01 0439)  Intake/Output from previous day: 11/30 0701 - 12/01 0700 In: 3132.3 [I.V.:2240.3; NG/GT:400; IV Piggyback:492] Out: [id number] [Urine:1290; Drains:26; Blood:100] Intake/Output this shift:    no exam, pupils small and reactive  Lab Results: Lab Results  Component Value Date   WBC 2.4* 09/19/2015   HGB 9.0* 09/19/2015   HCT 25.9* 09/19/2015   MCV 96.3 09/19/2015   PLT 49* 09/19/2015   Lab Results  Component Value Date   INR 1.20 09/18/2015   BMET Lab Results  Component Value Date   NA 135 09/19/2015   K 4.7 09/19/2015   CL 108 09/19/2015   CO2 19* 09/19/2015   GLUCOSE 236* 09/19/2015   BUN 27* 09/19/2015   CREATININE 1.33* 09/19/2015   CALCIUM 8.3* 09/19/2015    Studies/Results: Ct Head Wo Contrast  09/19/2015  CLINICAL DATA:  Follow-up subdural hematoma. EXAM: CT HEAD WITHOUT CONTRAST TECHNIQUE: Contiguous axial images were obtained from the base of the skull through the vertex without intravenous contrast. COMPARISON:  Prior study from 09/18/2015 as well as multiple previous exams. FINDINGS: Left parietal craniotomy site is stable in appearance with overlying skin staples. There has been placement of a small bore drain into the left subdural space. Pneumocephalus is slightly decreased. Hemostatic material subjacent to the craniotomy bone flap.Subdural hematoma along the left cerebral convexity measures up to 17 mm, decreased from prior. Persistent mass effect on the left  cerebral hemisphere with 4.5 mm of left-to-right shift, improved. No hydrocephalus. No ventricular entrapment. Basilar cisterns remain patent. No evidence for acute large vessel ischemia. Paranasal sinuses and mastoids are stable. No acute abnormality about the orbits. IMPRESSION: Decreased size of left subdural hematoma with improved 4-5 mm of left-to-right midline shift. Electronically Signed   By: Jeannine Boga M.D.   On: 09/19/2015 05:51   Ct Head Wo Contrast  09/18/2015  CLINICAL DATA:  Subdural hematoma follow-up EXAM: CT HEAD WITHOUT CONTRAST TECHNIQUE: Contiguous axial images were obtained from the base of the skull through the vertex without intravenous contrast. COMPARISON:  09/16/2015 FINDINGS: Unremarkable left parietal craniotomy site. A subdural drain has been removed, accounting for increase in pneumocephalus. Subdural hematoma along the left cerebral convexity has a stable size and distribution. Including hemostatic material below the bone flap, maximal thickness is 25 mm near the vertex. There is stable mass effect on the left cerebral hemisphere with 6-7 mm of midline shift. No herniation. No ACA ischemic change. No ventricular entrapment. IMPRESSION: Size stable left cerebral convexity subdural hematoma with 7 mm midline shift. Electronically Signed   By: Monte Fantasia M.D.   On: 09/18/2015 06:51   Portable Chest Xray  09/19/2015  CLINICAL DATA:  Several hematoma, diabetes, hepatic cirrhosis, intubated patient. EXAM: PORTABLE CHEST 1 VIEW COMPARISON:  Portable chest x-ray of September 18, 2015 FINDINGS: The lungs are adequately inflated. There is no focal infiltrate. The interstitial markings at both lung bases are slightly more prominent today. The heart  is normal in size. The pulmonary vascularity is normal. The mediastinum is normal in width. The patient has undergone previous median sternotomy. The endotracheal tube tip lies the esophagogastric tube tip projects below the  inferior margin of the image. The proximal port is at the GE junction. The bony thorax exhibits no acute abnormalities. Approximately 4.3 cm above the carina. IMPRESSION: Slight interval increase in interstitial density at both lung bases consistent with subsegmental atelectasis. Advancement of the nasogastric tube by 5-10 cm recommended to assure that the proximal port is positioned below the GE junction. Electronically Signed   By: Jaretssi Kraker  Martinique M.D.   On: 09/19/2015 07:18   Portable Chest Xray  09/18/2015  CLINICAL DATA:  Hypertension, diabetes, subdural hematoma. EXAM: PORTABLE CHEST 1 VIEW COMPARISON:  September 13, 2015 FINDINGS: The heart size and mediastinal contours are stable. Patient status post prior CABG and median sternotomy. Endotracheal tube is identified distal tip 6.4 cm from carina. Nasogastric tube is identified distal tip not included on film but is at least in the stomach. Both lungs are clear. The visualized skeletal structures are stable. IMPRESSION: No active cardiopulmonary disease. Endotracheal tube and nasogastric tube as described. Electronically Signed   By: Abelardo Diesel M.D.   On: 09/18/2015 17:52    Assessment/Plan: Stable, CT better, continue sedation and ventilation for now, seizure control per neurology; spoke with his sons at length   LOS: 6 days    Nathanyal Ashmead S 09/19/2015, 7:40 AM

## 2015-09-19 NOTE — Progress Notes (Signed)
Initial Nutrition Assessment  INTERVENTION:   D/C Vital High Protein  Initiate Vital AF 1.2 @ 45 ml/hr via OG tube   30 ml Prostat TID.    Tube feeding regimen provides 1596 kcal, 126 grams of protein, and 875 ml of H2O.   TF regimen and propofol at current rate providing 1850 total kcal/day (100 % of kcal needs)  NUTRITION DIAGNOSIS:   Inadequate oral intake related to inability to eat as evidenced by NPO status.  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 To OR 11/25 for L craniotomy. Did well post op until 11/30 when he developed global aphasia and R hemiparesis as well as ?seizure activity and went back to OR for redo crani. Post op was extubated initially but had deteriorating mental status and respiratory failure .  Patient is currently intubated on ventilator support MV: 8.8 L/min Temp (24hrs), Avg:97.9 F (36.6 C), Min:96.6 F (35.9 C), Max:98.8 F (37.1 C)  Propofol: 10.1 ml/hr provides: 266 kcal per day from lipids - for seizure management  Medications reviewed and include: vitamin K CBG's: 200-224 OG tube Son at bedside and reports that pt lives alone, does cook, seems to have good appetite. Recently looked back at some pictures and felt maybe pt had lost a little of his belly.  Pt discussed during ICU rounds and with RN.  Nutrition-Focused physical exam completed. Findings are no fat depletion, mild muscle depletion at clavicles, and mild upper extremity edema.    Diet Order:  Diet NPO time specified  Skin:  Reviewed, no issues  Last BM:  11/30  Height:   Ht Readings from Last 1  Encounters:  09/18/15 6' (1.829 m)   Weight:   Wt Readings from Last 1 Encounters:  09/19/15 188 lb 7.9 oz (85.5 kg)   Ideal Body Weight:  80.9 kg  BMI:  Body mass index is 25.56 kg/(m^2).  Estimated Nutritional Needs:   Kcal:  [id number]  Protein:  110-125 grams  Fluid:  > 1.8 L/day  EDUCATION NEEDS:      Benjamin Schroeder RD, [place], [place] Pager [phone] After Hours Pager

## 2015-09-19 NOTE — Progress Notes (Signed)
Patient ID: Benjamin Schroeder, male   DOB: 1946/10/12, 69 y.o.   MRN: [id number] Hematology: He did well with surgery on 11/30. No seizure activity overnight on Diprovan drip. Minimal drainage from surgical bed: only 26 ml overnight. Platelet count drifting down as expected but still reasonable at 49,000. Hb 9. Protime remains minimally increased above control @ 15.3 sec. WBC low but stable - likely his baseline. PERRL He withdraws his left arm rapidly to painful stimuli. RUE remains flaccid. Both sons present. Confirm high impact fall on right side 3 weeks ago. No head trauma or LOC. Etiology of cirrhosis is fatty liver Rec:  I have re-ordered IV AMICAR for 4 days. Platelet transfusions as needed. I have had blood bank flag donor from 11/28 to see if he/she would be willing to donate again given good clinical result with platelets from that donor. I will be out of town at a meeting but will follow remotely. I can be reached 24/7 on my cell [phone].

## 2015-09-19 NOTE — Procedures (Signed)
ELECTROENCEPHALOGRAM REPORT   Patient: Benjamin Schroeder       Room #: 40M-04 Age: 69 y.o.        Sex: male Referring Physician: Dr Ronnald Ramp Report Date:  09/19/2015        Interpreting Physician: Hulen Luster  History: Benjamin Schroeder is an 69 y.o. male SDH s/p evacuation with concern for partial seizures  Medications:  I have reviewed the patient's current medications.  Conditions of Recording:  This is a 16 channel EEG carried out with the patient in the intubated, unresponsive state. Off all sedation.   Description:  The background activity consists of a low voltage, symmetrical, poorly organized, mix of theta and delta activity. No posterior dominant alpha rhythm is noted. No focal slowing or epileptiform activity is noted. Previously noted sharp wave activity has resolved.  Hyperventilation was not performed. Intermittent photic stimulation was not performed.  IMPRESSION: Abnormal EEG due to the presence of generalized slowing indicating a moderate to severe cerebral disturbance (encephalopathy). No epileptiform activity noted. Previously noted left-sided PLEDs have resolved.    Jim Like, DO [facility] [phone]  If 7pm- 7am, please page neurology on call as listed in [place]. 09/19/2015, 2:49 PM

## 2015-09-20 ENCOUNTER — Inpatient Hospital Stay (HOSPITAL_COMMUNITY): Payer: Medicare Other

## 2015-09-20 DIAGNOSIS — K7030 Alcoholic cirrhosis of liver without ascites: Secondary | ICD-10-CM

## 2015-09-20 DIAGNOSIS — E875 Hyperkalemia: Secondary | ICD-10-CM

## 2015-09-20 LAB — BLOOD GAS, ARTERIAL
ACID-BASE DEFICIT: 4.7 mmol/L — AB (ref 0.0–2.0)
Bicarbonate: 18.8 mEq/L — ABNORMAL LOW (ref 20.0–24.0)
DRAWN BY: 364961
FIO2: 0.4
MECHVT: 620 mL
O2 SAT: 99.2 %
PATIENT TEMPERATURE: 98.6
PCO2 ART: 28.3 mmHg — AB (ref 35.0–45.0)
PEEP/CPAP: 5 cmH2O
PH ART: 7.437 (ref 7.350–7.450)
RATE: 16 resp/min
TCO2: 19.7 mmol/L (ref 0–100)
pO2, Arterial: 158 mmHg — ABNORMAL HIGH (ref 80.0–100.0)

## 2015-09-20 LAB — GLUCOSE, CAPILLARY
GLUCOSE-CAPILLARY: 122 mg/dL — AB (ref 65–99)
GLUCOSE-CAPILLARY: 131 mg/dL — AB (ref 65–99)
GLUCOSE-CAPILLARY: 178 mg/dL — AB (ref 65–99)
Glucose-Capillary: 141 mg/dL — ABNORMAL HIGH (ref 65–99)
Glucose-Capillary: 163 mg/dL — ABNORMAL HIGH (ref 65–99)

## 2015-09-20 LAB — CBC
HCT: 29.6 % — ABNORMAL LOW (ref 39.0–52.0)
Hemoglobin: 10.5 g/dL — ABNORMAL LOW (ref 13.0–17.0)
MCH: 34.2 pg — AB (ref 26.0–34.0)
MCHC: 35.5 g/dL (ref 30.0–36.0)
MCV: 96.4 fL (ref 78.0–100.0)
PLATELETS: 59 10*3/uL — AB (ref 150–400)
RBC: 3.07 MIL/uL — ABNORMAL LOW (ref 4.22–5.81)
RDW: 12.3 % (ref 11.5–15.5)
WBC: 3.6 10*3/uL — ABNORMAL LOW (ref 4.0–10.5)

## 2015-09-20 LAB — BASIC METABOLIC PANEL
Anion gap: 8 (ref 5–15)
BUN: 33 mg/dL — AB (ref 6–20)
CALCIUM: 8.2 mg/dL — AB (ref 8.9–10.3)
CO2: 18 mmol/L — AB (ref 22–32)
CREATININE: 1.08 mg/dL (ref 0.61–1.24)
Chloride: 109 mmol/L (ref 101–111)
GFR calc Af Amer: >60 mL/min (ref >60)
GFR calc non Af Amer: >60 mL/min (ref >60)
GLUCOSE: 145 mg/dL — AB (ref 65–99)
Potassium: 6 mmol/L — ABNORMAL HIGH (ref 3.5–5.1)
Sodium: 135 mmol/L (ref 135–145)

## 2015-09-20 LAB — PHOSPHORUS: Phosphorus: 2.3 mg/dL — ABNORMAL LOW (ref 2.5–4.6)

## 2015-09-20 LAB — MAGNESIUM: Magnesium: 2.1 mg/dL (ref 1.7–2.4)

## 2015-09-20 MED ORDER — SODIUM POLYSTYRENE SULFONATE 15 GM/60ML PO SUSP
30.0000 g | Freq: Once | ORAL | Status: AC
  Administered 2015-09-20: 30 g via ORAL
  Filled 2015-09-20: qty 120

## 2015-09-20 NOTE — Progress Notes (Signed)
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 He had seizures 2 days ago and currently on Keppra 1 g twice a day, and Dilantin 100 mg every 8 hours. He did not not have any further clinical seizures. His last EEG yesterday did not show any abnormal epileptiform Discharges.    Current facility-administered medications:  .  0.9 %  sodium chloride infusion, , Intravenous, Once, Annia Belt, MD .  acetaminophen (TYLENOL) tablet 650 mg, 650 mg, Oral, Q4H PRN **OR** acetaminophen (TYLENOL) suppository 650 mg, 650 mg, Rectal, Q4H PRN, Eustace Moore, MD .  antiseptic oral rinse solution (CORINZ), 7 mL, Mouth Rinse, 10 times per day, Eustace Moore, MD, 7 mL at 09/20/15 1818 .  atenolol (TENORMIN) tablet 25 mg, 25 mg, Oral, Daily, Eustace Moore, MD, 25 mg at 09/20/15 1111 .  chlorhexidine gluconate (PERIDEX) 0.12 % solution 15 mL, 15 mL, Mouth Rinse, BID, Eustace Moore, MD, 15 mL at 09/20/15 [phone] .  feeding supplement (PRO-STAT SUGAR FREE 64) liquid 30 mL, 30 mL, Per Tube, TID, [name], RD, 30 mL at 09/20/15 1111 .  feeding supplement (VITAL AF 1.2 CAL) liquid 1,000 mL, 1,000 mL, Per Tube, Continuous, [name], RD, Stopped at 09/20/15 1100 .  fentaNYL (SUBLIMAZE) injection 50 mcg, 50 mcg, Intravenous, Q15 min PRN, [name], NP .  fentaNYL (SUBLIMAZE) injection 50 mcg, 50 mcg, Intravenous, Q2H PRN, [name], NP .  ferrous sulfate tablet 325 mg, 325 mg, Oral, Q breakfast, Eustace Moore, MD, 325 mg at 09/20/15 0740 .  insulin aspart (novoLOG) injection 0-15 Units, 0-15 Units, Subcutaneous, 6 times per day, [name], NP, 3 Units at 09/20/15 1618 .  labetalol (NORMODYNE,TRANDATE) injection 10-40 mg, 10-40 mg, Intravenous, Q10 min PRN, Eustace Moore, MD .  levETIRAcetam (KEPPRA) 1,000 mg in sodium chloride 0.9 % 100 mL IVPB, 1,000 mg, Intravenous, Q12H,  Jakyrah Holladay Fuller Mandril, MD, 1,000 mg at 09/20/15 1110 .  midazolam (VERSED) injection 1 mg, 1 mg, Intravenous, Q15 min PRN, [name], NP .  midazolam (VERSED) injection 1 mg, 1 mg, Intravenous, Q2H PRN, [name], NP .  ondansetron (ZOFRAN) tablet 4 mg, 4 mg, Oral, Q4H PRN **OR** ondansetron (ZOFRAN) injection 4 mg, 4 mg, Intravenous, Q4H PRN, Eustace Moore, MD .  pantoprazole (PROTONIX) injection 40 mg, 40 mg, Intravenous, QHS, Eustace Moore, MD, 40 mg at 09/19/15 2145 .  phenytoin (DILANTIN) injection 100 mg, 100 mg, Intravenous, 3 times per day, Richanda Darin Fuller Mandril, MD, 100 mg at 09/20/15 1454 .  promethazine (PHENERGAN) tablet 12.5-25 mg, 12.5-25 mg, Oral, Q4H PRN, Eustace Moore, MD .  propofol (DIPRIVAN) 1000 MG/100ML infusion, 5-80 mcg/kg/min, Intravenous, Titrated, [name], MD, Last Rate: 5 mL/hr at 09/20/15 0700, 9.944 mcg/kg/min at 09/20/15 0700    Exam: Filed Vitals:   09/20/15 1800 09/20/15 1900  BP: 119/48 123/42  Pulse: 77 77  Temp:    Resp: 19 18   Patient is still on propofol 10 mics per kilo per minute and staff examination. He is intubated. Midline gaze, no specific gaze deviation or preference. Pupils equal and reactive. He was following commands, able to sustain antigravity in the left upper and lower extremity is. He did not have any voluntary movement in the right upper and lower extremities.     Impression:  69 year old male patient with a  left cerebral convex to acute on chronic subdural hematoma, status post evacuation of the craniectomy 2 days ago, status post seizures secondary to subdural hematoma. He did not have any further clinical seizures. Currently on Keppra 1 g twice a day and Dilantin 100 mg 3 times a day. Recommend to continue the same. His last EEG yesterday showed resolution of the abnormal Discharges. His following commands appropriately and had sustained antegrade strength in the left upper and lower  studies, with hemiparesis in the right side expected from the left sided subdural hematoma. He will be weaned off from the propofol and work towards extubation, managed by pulmonary critical care specialist.  We'll obtain a brain MRI study when stable, and repeat EEG tomorrow off sedation.  Neurology service will continue to follow up . Please call for any further questions.

## 2015-09-20 NOTE — Progress Notes (Signed)
Hematology: He got platelets yesterday w minor rise from 49 to 59,000. I have ordered pooled donor platelets for today. Minimal drainage from surgical bed. Continue current level of support.

## 2015-09-20 NOTE — Progress Notes (Signed)
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  To OR 11/25 for L craniotomy.  Did well post op until 11/30 when he developed global aphasia and R hemiparesis as well as ?seizure activity and went back to OR for redo crani.  Post op was extubated initially but had deteriorating mental status and respiratory failure and PCCM was consulted for intubation and vent management.   SUBJECTIVE: No events overnight, remains on propofol.  VITAL SIGNS: BP 123/46 mmHg  Pulse 75  Temp(Src) 98.7 F (37.1 C) (Axillary)  Resp 14  Ht 6' (1.829 m)  Wt 83.2 kg (183 lb 6.8 oz)  BMI 24.87 kg/m2  SpO2 100%  HEMODYNAMICS:    VENTILATOR SETTINGS: Vent Mode:  [-] PSV FiO2 (%):  [40 %] 40 % Set Rate:  [14 bmp] 14 bmp Vt Set:  [620 mL] 620 mL PEEP:  [5 cmH20] 5 cmH20 Pressure Support:  [5 cmH20] 5 cmH20 Plateau Pressure:  [9 cmH20-14 cmH20] 9 cmH20  INTAKE / OUTPUT: I/O last 3 completed shifts: In: 5217.5 [I.V.:3004.5; NG/GT:1435; IV Piggyback:778] Out: 2579 [Urine:2535; Drains:44]  PHYSICAL EXAMINATION: General:  Chronically ill appearing male, acutely ill, arousable but lethargic on propofol Neuro:  Arousable moving L>R to command, weak on right. HEENT:  Mm moist, no JVD  Cardiovascular:  s1s2 rrr Lungs:  Coarse BS diffusely. Abdomen:  Round, soft, +bs  Musculoskeletal:  Warm and dry, no edema   LABS:  CBC  Recent Labs Lab 09/18/15 0342 09/19/15 0419 09/20/15 0553  WBC 2.5* 2.4* 3.6*  HGB 9.7* 9.0* 10.5*  HCT 28.0* 25.9* 29.6*  PLT 82* 49* 59*   Coag's  Recent Labs Lab 09/13/15 1255 09/16/15 1554 09/18/15 0342   APTT 29  --   --   INR 1.19 1.33 1.20   BMET  Recent Labs Lab 09/16/15 1554 09/19/15 0419 09/20/15 0308  NA 130* 135 135  K 4.3 4.7 6.0*  CL 102 108 109  CO2 23 19* 18*  BUN 16 27* 33*  CREATININE 1.18 1.33* 1.08  GLUCOSE 121* 236* 145*   Electrolytes  Recent Labs Lab 09/16/15 1554 09/19/15 0419 09/20/15 0308  CALCIUM 8.4* 8.3* 8.2*  MG  --  1.8 2.1  PHOS  --   --  2.3*   Sepsis Markers No results for input(s): LATICACIDVEN, PROCALCITON, O2SATVEN in the last 168 hours. ABG  Recent Labs Lab 09/18/15 1725 09/20/15 0404  PHART 7.417 7.437  PCO2ART 27.4* 28.3*  PO2ART 501* 158*   Liver Enzymes  Recent Labs Lab 09/16/15 1554 09/18/15 1831  AST 28 33  ALT 22 35  ALKPHOS 63  --   BILITOT 1.8*  --   ALBUMIN 2.8*  --    Cardiac Enzymes  Recent Labs Lab 09/13/15 1255  TROPONINI <0.03   Glucose  Recent Labs Lab 09/19/15 1139 09/19/15 1626 09/19/15 1928 09/19/15 2350 09/20/15 0342 09/20/15 0731  GLUCAP 198* 187* 160* 147* 122* 131*   Imaging Dg Chest Port 1 View  09/20/2015  CLINICAL DATA:  Intubated patient, respiratory failure; status post craniotomy for subdural hematoma following traumatic brain injury EXAM: PORTABLE CHEST 1 VIEW  COMPARISON:  Portable chest x-ray of September 19, 2015 FINDINGS: The lungs are adequately inflated. There is no focal infiltrate. Interstitial density at the lung bases is slightly less conspicuous today. The heart and pulmonary vascularity are normal. The patient has undergone previous CABG. The endotracheal tube tip lies 4.7 cm above the carina. The esophagogastric tube tip projects below the inferior margin of the image. IMPRESSION: Slight interval improvement in subsegmental atelectasis at the lung bases. There is no CHF or pneumonia. The support tubes are in stable position. Electronically Signed   By: David  Martinique M.D.   On: 09/20/2015 07:31   STUDIES:  CT head 11/25>>> acute on chronic moderate to large L SDH with  62mm left-right shift  CT head 11/30>>> Size stable left cerebral convexity subdural hematoma with 7 mm midline shift.  CULTURES: Sputum 11/30>>  ANTIBIOTICS: None  SIGNIFICANT EVENTS: 11/25 admitted, SDH, to OR  11/30 aphasia, R hemiparesis, ?seizure - back to OR for redo crani   LINES/TUBES: ETT 11/30>>>12/2  DISCUSSION: 69 yo male with SDH in setting fall with chronic thrombocytopenia r/t cirrhosis.  S/p crani 11/25 with redo crani 11/30 after ?seizure activity and aphasia.   ASSESSMENT / PLAN:  NEUROLOGIC Large SDH - s/p crani 11/25 and redo 11/30 ?seziure  P:   D/C propofol. D/C versed and fentanyl. Keppra, dilantin, decadron per nsgy  EEG per neuro  PULMONARY Acute respiratory failure  P:   Vent support - 8cc/kg  Sputum culture  Hold off on abx for now - see ID  ABG and adjust vent for ABG  CARDIOVASCULAR Hx HTN  CAD s/p CABG  P:  Hold ACE Goal SBP 120 PRN labetalol Continue atenolol  RENAL Hyponatremia  CKD  Hyperkalemia P:   BMET in AM. Replace electrolytes as indicated. KVO IVF. Kayexalate x1.  GASTROINTESTINAL ?hx Cirrhosis - ?NASH, no hx ETOH  P:   D/C TF SLP per speech PPI   HEMATOLOGIC Thrombocytopenia - chronic  SDH Anemia  P:  SCD's  F/u CBC   INFECTIOUS No active issue  P:   Sputum culture with significant secretions  Trend wbc, fever curve  Low threshold to add abx for ?aspiration if spikes fever   ENDOCRINE DM P:   SSI  FAMILY  - Updates: Sons updated at length at bedside 12/2 and posed the question of if fails then do we reintubate with the understanding it means a tracheostomy and they are discussing this right now.  The patient is critically ill with multiple organ systems failure and requires high complexity decision making for assessment and support, frequent evaluation and titration of therapies, application of advanced monitoring technologies and extensive interpretation of multiple databases.   Critical  Care Time devoted to patient care services described in this note is  35  Minutes. This time reflects time of care of this signee Dr Jennet Maduro. This critical care time does not reflect procedure time, or teaching time or supervisory time of PA/NP/Med student/Med Resident etc but could involve care discussion time.  Rush Farmer, M.D. [facility] Pulmonary/Critical Care Medicine. Pager: [phone]. After hours pager: [phone].

## 2015-09-20 NOTE — Procedures (Signed)
Extubation Procedure Note  Patient Details:   Name: Benjamin Schroeder DOB: 1946/07/18 MRN: [id number]   Airway Documentation:  Airway 8 mm (Active)  Secured at (cm) 24 cm 09/20/2015 11:42 AM  Measured From Lips 09/20/2015 11:42 AM  Secured Location Right 09/20/2015 11:42 AM  Secured By [employer] 09/20/2015 11:42 AM  Tube Holder Repositioned Yes 09/20/2015 11:42 AM  Cuff Pressure (cm H2O) 28 cm H2O 09/20/2015  3:51 AM  Site Condition Dry 09/20/2015 11:42 AM   Extubated to 4lpm [place], no distress noted. Evaluation  O2 sats: stable throughout Complications: No apparent complications Patient did tolerate procedure well. Bilateral Breath Sounds: Clear Suctioning: Oral, Airway Yes  Estaban [place] Wyatt Haste 09/20/2015, 12:33 PM

## 2015-09-20 NOTE — Progress Notes (Signed)
Patient ID: Benjamin Schroeder, male   DOB: 1946-01-31, 69 y.o.   MRN: [id number] Subjective: Patient reports sedated and intubated  Objective: Vital signs in last 24 hours: Temp:  [98.2 F (36.8 C)-98.4 F (36.9 C)] 98.3 F (36.8 C) (12/02 0351) Pulse Rate:  [53-95] 65 (12/02 0200) Resp:  [12-22] 18 (12/02 0351) BP: (101-141)/(44-68) 124/52 mmHg (12/02 0351) SpO2:  [87 %-100 %] 100 % (12/02 0351) FiO2 (%):  [40 %-60 %] 40 % (12/02 0351) Weight:  [83.2 kg (183 lb 6.8 oz)] 83.2 kg (183 lb 6.8 oz) (12/02 0500)  Intake/Output from previous day: 12/01 0701 - 12/02 0700 In: 2800.5 [I.V.:1576.5; NG/GT:830; IV Piggyback:394] Out: [id number] [Urine:1550; Drains:21] Intake/Output this shift: Total I/O In: 1078.7 [I.V.:595.7; NG/GT:315; IV Piggyback:168] Out: 764 [Urine:750; Drains:14]  he will open his eyes, i do think he will FC at times, moves his L side well, R arm with nmo movement yet  Lab Results: Lab Results  Component Value Date   WBC 2.4* 09/19/2015   HGB 9.0* 09/19/2015   HCT 25.9* 09/19/2015   MCV 96.3 09/19/2015   PLT 49* 09/19/2015   Lab Results  Component Value Date   INR 1.20 09/18/2015   BMET Lab Results  Component Value Date   NA 135 09/20/2015   K 6.0* 09/20/2015   CL 109 09/20/2015   CO2 18* 09/20/2015   GLUCOSE 145* 09/20/2015   BUN 33* 09/20/2015   CREATININE 1.08 09/20/2015   CALCIUM 8.2* 09/20/2015    Studies/Results: Ct Head Wo Contrast  09/19/2015  CLINICAL DATA:  Follow-up subdural hematoma. EXAM: CT HEAD WITHOUT CONTRAST TECHNIQUE: Contiguous axial images were obtained from the base of the skull through the vertex without intravenous contrast. COMPARISON:  Prior study from 09/18/2015 as well as multiple previous exams. FINDINGS: Left parietal craniotomy site is stable in appearance with overlying skin staples. There has been placement of a small bore drain into the left subdural space. Pneumocephalus is slightly decreased. Hemostatic material subjacent to  the craniotomy bone flap.Subdural hematoma along the left cerebral convexity measures up to 17 mm, decreased from prior. Persistent mass effect on the left cerebral hemisphere with 4.5 mm of left-to-right shift, improved. No hydrocephalus. No ventricular entrapment. Basilar cisterns remain patent. No evidence for acute large vessel ischemia. Paranasal sinuses and mastoids are stable. No acute abnormality about the orbits. IMPRESSION: Decreased size of left subdural hematoma with improved 4-5 mm of left-to-right midline shift. Electronically Signed   By: Jeannine Boga M.D.   On: 09/19/2015 05:51   Portable Chest Xray  09/19/2015  CLINICAL DATA:  Several hematoma, diabetes, hepatic cirrhosis, intubated patient. EXAM: PORTABLE CHEST 1 VIEW COMPARISON:  Portable chest x-ray of September 18, 2015 FINDINGS: The lungs are adequately inflated. There is no focal infiltrate. The interstitial markings at both lung bases are slightly more prominent today. The heart is normal in size. The pulmonary vascularity is normal. The mediastinum is normal in width. The patient has undergone previous median sternotomy. The endotracheal tube tip lies the esophagogastric tube tip projects below the inferior margin of the image. The proximal port is at the GE junction. The bony thorax exhibits no acute abnormalities. Approximately 4.3 cm above the carina. IMPRESSION: Slight interval increase in interstitial density at both lung bases consistent with subsegmental atelectasis. Advancement of the nasogastric tube by 5-10 cm recommended to assure that the proximal port is positioned below the GE junction. Electronically Signed   By: Shean Gerding  Martinique M.D.   On: 09/19/2015 07:18  Portable Chest Xray  09/18/2015  CLINICAL DATA:  Hypertension, diabetes, subdural hematoma. EXAM: PORTABLE CHEST 1 VIEW COMPARISON:  September 13, 2015 FINDINGS: The heart size and mediastinal contours are stable. Patient status post prior CABG and median  sternotomy. Endotracheal tube is identified distal tip 6.4 cm from carina. Nasogastric tube is identified distal tip not included on film but is at least in the stomach. Both lungs are clear. The visualized skeletal structures are stable. IMPRESSION: No active cardiopulmonary disease. Endotracheal tube and nasogastric tube as described. Electronically Signed   By: Abelardo Diesel M.D.   On: 09/18/2015 17:52    Assessment/Plan: Slowly improving, wean vent today, i removed the SD drain   LOS: 7 days    Sylvester Minton S 09/20/2015, 6:52 AM

## 2015-09-21 ENCOUNTER — Inpatient Hospital Stay (HOSPITAL_COMMUNITY): Payer: Medicare Other

## 2015-09-21 LAB — CBC
HCT: 29.2 % — ABNORMAL LOW (ref 39.0–52.0)
Hemoglobin: 9.8 g/dL — ABNORMAL LOW (ref 13.0–17.0)
MCH: 33.1 pg (ref 26.0–34.0)
MCHC: 33.6 g/dL (ref 30.0–36.0)
MCV: 98.6 fL (ref 78.0–100.0)
PLATELETS: 56 10*3/uL — AB (ref 150–400)
RBC: 2.96 MIL/uL — ABNORMAL LOW (ref 4.22–5.81)
RDW: 12.6 % (ref 11.5–15.5)
WBC: 3.2 10*3/uL — ABNORMAL LOW (ref 4.0–10.5)

## 2015-09-21 LAB — BASIC METABOLIC PANEL
Anion gap: 8 (ref 5–15)
BUN: 24 mg/dL — AB (ref 6–20)
CALCIUM: 8.4 mg/dL — AB (ref 8.9–10.3)
CO2: 22 mmol/L (ref 22–32)
CREATININE: 1.02 mg/dL (ref 0.61–1.24)
Chloride: 113 mmol/L — ABNORMAL HIGH (ref 101–111)
GFR calc non Af Amer: >60 mL/min (ref >60)
GLUCOSE: 121 mg/dL — AB (ref 65–99)
Potassium: 3.6 mmol/L (ref 3.5–5.1)
Sodium: 143 mmol/L (ref 135–145)

## 2015-09-21 LAB — GLUCOSE, CAPILLARY
GLUCOSE-CAPILLARY: 140 mg/dL — AB (ref 65–99)
GLUCOSE-CAPILLARY: 141 mg/dL — AB (ref 65–99)
GLUCOSE-CAPILLARY: 185 mg/dL — AB (ref 65–99)
Glucose-Capillary: 102 mg/dL — ABNORMAL HIGH (ref 65–99)
Glucose-Capillary: 141 mg/dL — ABNORMAL HIGH (ref 65–99)
Glucose-Capillary: 159 mg/dL — ABNORMAL HIGH (ref 65–99)
Glucose-Capillary: 189 mg/dL — ABNORMAL HIGH (ref 65–99)

## 2015-09-21 LAB — PHOSPHORUS: Phosphorus: 3.6 mg/dL (ref 2.5–4.6)

## 2015-09-21 LAB — CULTURE, RESPIRATORY

## 2015-09-21 LAB — PREPARE PLATELET PHERESIS: Unit division: 0

## 2015-09-21 LAB — CULTURE, RESPIRATORY W GRAM STAIN: Special Requests: NORMAL

## 2015-09-21 LAB — MAGNESIUM: MAGNESIUM: 1.8 mg/dL (ref 1.7–2.4)

## 2015-09-21 MED ORDER — GADOBENATE DIMEGLUMINE 529 MG/ML IV SOLN
19.0000 mL | Freq: Once | INTRAVENOUS | Status: AC | PRN
  Administered 2015-09-21: 19 mL via INTRAVENOUS

## 2015-09-21 MED ORDER — AMANTADINE HCL 50 MG/5ML PO SYRP
100.0000 mg | ORAL_SOLUTION | Freq: Two times a day (BID) | ORAL | Status: DC
  Administered 2015-09-21 – 2015-09-28 (×14): 100 mg via NASOGASTRIC
  Filled 2015-09-21 (×17): qty 10

## 2015-09-21 NOTE — Procedures (Signed)
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 He had seizures 3 days ago and currently on Keppra 1 g twice a day, and Dilantin 100 mg every 8 hours. He did not not have any further clinical seizures.    Current facility-administered medications:  .  acetaminophen (TYLENOL) tablet 650 mg, 650 mg, Oral, Q4H PRN **OR** acetaminophen (TYLENOL) suppository 650 mg, 650 mg, Rectal, Q4H PRN, Benjamin Moore, MD .  amantadine [facility]) solution 100 mg, 100 mg, Per NG tube, BID, Benjamin Stroschein Fuller Mandril, MD, 100 mg at 09/21/15 1841 .  antiseptic oral rinse solution (CORINZ), 7 mL, Mouth Rinse, 10 times per day, Benjamin Moore, MD, 7 mL at 09/21/15 2105 .  atenolol (TENORMIN) tablet 25 mg, 25 mg, Oral, Daily, Benjamin Moore, MD, 25 mg at 09/21/15 1840 .  chlorhexidine gluconate (PERIDEX) 0.12 % solution 15 mL, 15 mL, Mouth Rinse, BID, Benjamin Moore, MD, 15 mL at 09/21/15 2052 .  feeding supplement (PRO-STAT SUGAR FREE 64) liquid 30 mL, 30 mL, Per Tube, TID, Benjamin Schroeder, RD, 30 mL at 09/21/15 2200 .  feeding supplement (VITAL AF 1.2 CAL) liquid 1,000 mL, 1,000 mL, Per Tube, Continuous, Benjamin Schroeder, RD, Last Rate: 45 mL/hr at 09/21/15 1833, 1,000 mL at 09/21/15 1833 .  fentaNYL (SUBLIMAZE) injection 50 mcg, 50 mcg, Intravenous, Q15 min PRN, [name], NP .  fentaNYL (SUBLIMAZE) injection 50 mcg, 50 mcg, Intravenous, Q2H PRN, [name], NP .  ferrous sulfate tablet 325 mg, 325 mg, Oral, Q breakfast, Benjamin Moore, MD, 325 mg at 09/20/15 0740 .  insulin aspart (novoLOG) injection 0-15 Units, 0-15 Units, Subcutaneous, 6 times per day, [name], NP, 3 Units at 09/21/15 2051 .  labetalol (NORMODYNE,TRANDATE) injection 10-40 mg, 10-40 mg, Intravenous, Q10 min PRN, Benjamin Moore, MD, 20 mg at 09/21/15 2048 .  levETIRAcetam (KEPPRA) 1,000 mg in sodium chloride 0.9 % 100 mL IVPB,  1,000 mg, Intravenous, Q12H, Benjamin Schroeder Fuller Mandril, MD, 1,000 mg at 09/21/15 2106 .  midazolam (VERSED) injection 1 mg, 1 mg, Intravenous, Q15 min PRN, [name], NP .  midazolam (VERSED) injection 1 mg, 1 mg, Intravenous, Q2H PRN, [name], NP .  ondansetron (ZOFRAN) tablet 4 mg, 4 mg, Oral, Q4H PRN **OR** ondansetron (ZOFRAN) injection 4 mg, 4 mg, Intravenous, Q4H PRN, Benjamin Moore, MD .  pantoprazole (PROTONIX) injection 40 mg, 40 mg, Intravenous, QHS, Benjamin Moore, MD, 40 mg at 09/21/15 2105 .  phenytoin (DILANTIN) injection 100 mg, 100 mg, Intravenous, 3 times per day, Benjamin Warrior Fuller Mandril, MD, 100 mg at 09/21/15 2105 .  promethazine (PHENERGAN) tablet 12.5-25 mg, 12.5-25 mg, Oral, Q4H PRN, Benjamin Moore, MD .  propofol (DIPRIVAN) 1000 MG/100ML infusion, 5-80 mcg/kg/min, Intravenous, Titrated, [name], MD, Last Rate: 5 mL/hr at 09/20/15 0700, 9.944 mcg/kg/min at 09/20/15 0700  Introduction:  This is a 19 channel routine scalp EEG performed at the bedside with bipolar and monopolar montages arranged in accordance to the international 10/20 system of electrode placement. One channel was dedicated to EKG recording.    Findings:  Generalized background slowing in the mid theta range is noted throughout the recording, with an asymmetric low attenuation in the left hemispheric leads likely from the known left subdural hematoma. No definite evidence of abnormal epileptiform discharges or electrographic seizures were noted during this recording.  Impression:  This is an abnormal routine inpatient EEG suggestive of at least moderate encephalopathy. Clinical correlation is recommended .

## 2015-09-21 NOTE — Progress Notes (Signed)
No acute events AVSS Awake, alert, oriented to person and place Follows commands bilaterally but with fairly dense right hemiparesis Bandage clean, dry, intact Neurologically stable I agree with urology plan to obtain MRI and then EEG Continue supportive care

## 2015-09-21 NOTE — Progress Notes (Signed)
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 He had seizures 3 days ago and currently on Keppra 1 g twice a day, and Dilantin 100 mg every 8 hours. He did not not have any further clinical seizures.   He had a repeat EEG done today which showed evidence of moderate encephalopathy, but no definite evidence of any abnormal epileptiform discharges.    Current facility-administered medications:  .  acetaminophen (TYLENOL) tablet 650 mg, 650 mg, Oral, Q4H PRN **OR** acetaminophen (TYLENOL) suppository 650 mg, 650 mg, Rectal, Q4H PRN, Eustace Moore, MD .  amantadine [facility]) solution 100 mg, 100 mg, Per NG tube, BID, Jartavious Mckimmy Fuller Mandril, MD, 100 mg at 09/21/15 1841 .  antiseptic oral rinse solution (CORINZ), 7 mL, Mouth Rinse, 10 times per day, Eustace Moore, MD, 7 mL at 09/21/15 2105 .  atenolol (TENORMIN) tablet 25 mg, 25 mg, Oral, Daily, Eustace Moore, MD, 25 mg at 09/21/15 1840 .  chlorhexidine gluconate (PERIDEX) 0.12 % solution 15 mL, 15 mL, Mouth Rinse, BID, Eustace Moore, MD, 15 mL at 09/21/15 2052 .  feeding supplement (PRO-STAT SUGAR FREE 64) liquid 30 mL, 30 mL, Per Tube, TID, [name], RD, 30 mL at 09/21/15 2200 .  feeding supplement (VITAL AF 1.2 CAL) liquid 1,000 mL, 1,000 mL, Per Tube, Continuous, [name], RD, Last Rate: 45 mL/hr at 09/21/15 1833, 1,000 mL at 09/21/15 1833 .  fentaNYL (SUBLIMAZE) injection 50 mcg, 50 mcg, Intravenous, Q15 min PRN, [name], NP .  fentaNYL (SUBLIMAZE) injection 50 mcg, 50 mcg, Intravenous, Q2H PRN, [name], NP .  ferrous sulfate tablet 325 mg, 325 mg, Oral, Q breakfast, Eustace Moore, MD, 325 mg at 09/20/15 0740 .  insulin aspart (novoLOG) injection 0-15 Units, 0-15 Units, Subcutaneous, 6 times per day, [name], NP, 3 Units at 09/21/15 2051 .  labetalol (NORMODYNE,TRANDATE) injection 10-40  mg, 10-40 mg, Intravenous, Q10 min PRN, Eustace Moore, MD, 20 mg at 09/21/15 2048 .  levETIRAcetam (KEPPRA) 1,000 mg in sodium chloride 0.9 % 100 mL IVPB, 1,000 mg, Intravenous, Q12H, Samariya Rockhold Fuller Mandril, MD, 1,000 mg at 09/21/15 2106 .  midazolam (VERSED) injection 1 mg, 1 mg, Intravenous, Q15 min PRN, [name], NP .  midazolam (VERSED) injection 1 mg, 1 mg, Intravenous, Q2H PRN, [name], NP .  ondansetron (ZOFRAN) tablet 4 mg, 4 mg, Oral, Q4H PRN **OR** ondansetron (ZOFRAN) injection 4 mg, 4 mg, Intravenous, Q4H PRN, Eustace Moore, MD .  pantoprazole (PROTONIX) injection 40 mg, 40 mg, Intravenous, QHS, Eustace Moore, MD, 40 mg at 09/21/15 2105 .  phenytoin (DILANTIN) injection 100 mg, 100 mg, Intravenous, 3 times per day, Orphia Mctigue Fuller Mandril, MD, 100 mg at 09/21/15 2105 .  promethazine (PHENERGAN) tablet 12.5-25 mg, 12.5-25 mg, Oral, Q4H PRN, Eustace Moore, MD .  propofol (DIPRIVAN) 1000 MG/100ML infusion, 5-80 mcg/kg/min, Intravenous, Titrated, [name], MD, Last Rate: 5 mL/hr at 09/20/15 0700, 9.944 mcg/kg/min at 09/20/15 0700    Exam: Filed Vitals:   09/21/15 2047 09/21/15 2055  BP:  138/51  Pulse:  83  Temp: 100.5 F (38.1 C)   Resp:  19   Patient is extubated. He follows simple commands able to sustain antigravity in the left upper and lower extremities, no movement in the right upper and lower x-rays. He is very drowsy.  Midline  gaze, no specific gaze deviation or preference. Pupils equal and reactive. He was noted to have rigidity in the left upper extremity with cogwheeling, and resting tremor.    Impression:  69 year old male patient with a left cerebral convex to acute on chronic subdural hematoma, status post evacuation with craniectomy 3 days ago, status post seizures secondary to subdural hematoma. He did not have any further clinical seizures. Currently on Keppra 1 g twice a day and Dilantin 100 mg 3 times a day. Recommend to  continue the same.  Repeat EEG done today did not show any abnormal Discharges. Evidence of moderate encephalopathy is seen on the EEG.  A brain MRI was done today which showed few small tiny areas of acute restricted diffusion in the left MCA vascular territory and the insular cortex and the parietal cortex. Given the small size of these acute areas of restricted diffusion, it would be difficult to believe that they are causing the significant right sided weakness noted on exam. No evidence of seizures or continued abnormal discharges were seen on the EEG.  To further evaluate for any left MCA vascular abnormality or perfusion deficits, I recommend CT angiogram of the head and neck with a perfusion study for further evaluation.  He was noted to have rigidity in the left upper extremity with cogwheeling, and resting tremor. This likely represents a baseline Parkinson's disease. Recommend starting amantadine syrup 100 mg via NG tube twice a day at 8 AM and 1 PM to improve the rigidity.   Neurology service will continue to follow up . Please call for any further questions.

## 2015-09-21 NOTE — Care Management Important Message (Signed)
Important Message  Patient Details  Name: Benjamin Schroeder MRN: [id number] Date of Birth: 09-Sep-1946   Medicare Important Message Given:  Yes    Nathen May 09/21/2015, 12:59 PM

## 2015-09-21 NOTE — Evaluation (Signed)
Clinical/Bedside Swallow Evaluation Patient Details  Name: Benjamin Schroeder MRN: [id number] Date of Birth: August 13, 1946  Today's Date: 09/21/2015 Time:        Past Medical History:  Past Medical History  Diagnosis Date  . Hypertension   . Diabetes mellitus without complication ([place])   . Blood transfusion without reported diagnosis   . Cirrhosis of liver ([place])   . Coronary artery disease   . Skin cancer    Past Surgical History:  Past Surgical History  Procedure Laterality Date  . Coronary artery bypass graft    . Skin cancer excision    . Craniotomy Left 09/13/2015    Procedure: CRANIOTOMY HEMATOMA EVACUATION SUBDURAL;  Surgeon: Eustace Moore, MD;  Location: [facility] NEURO ORS;  Service: Neurosurgery;  Laterality: Left;  . Craniotomy Left 09/18/2015    Procedure: Craniotomy for evacuation of recurrent left subdural hematoma;  Surgeon: Eustace Moore, MD;  [facility]URO [facility]S;  Service: Neurosurgery;  Laterality: Left;   HPI:  Pt is a 31 male who suffered a mechanical fall 3-4 weeks ago who came to ED with HA on 11/25. Pt found to have L SDH. Pt s/p L craniotomy for evactuation of subdural hematoma on 11/25. He was taken back to the OR to redo his initial crani.  Post op he was initially extubated but had to be re-intubated due to deteriorating mental status.     Assessment / Plan / Recommendation Clinical Impression    Limited clinical swallowing evaluation was completed due to the patient's poor alertness level.  Oral mech exam was unable to be completed due to difficulty following all presented directions even given visual cue.  The patient presented with oropharyngeal dysphagia characterized by poor lip closure, delayed oral transit and delayed swallow trigger.  Multiple swallows were observed to clear small amounts of ice.  Wet vocal quality and very delayed cough response were noted post swallow.  Safest diet currently is NPO.  ST will follow up for PO readiness.      Aspiration Risk  Severe   Diet Recommendation   NPO             Frequency and Duration   Min 3x/week         Prognosis   Good     Swallow Study   General Date of Onset: 09/13/15 HPI: Pt is a 1 male who suffered a mechanical fall 3-4 weeks ago who came to ED with HA on 11/25. Pt found to have L SDH. Pt s/p L craniotomy for evactuation of subdural hematoma on 11/25. He was taken back to the OR to redo his initial crani.  Post op he was initially extubated but had to be re-intubated due to deteriorating mental status.   Type of Study: Bedside Swallow Evaluation Previous Swallow Assessment: 09/13/2015 with rx for dysphagia 1/thin.   Diet Prior to this Study: NPO Temperature Spikes Noted: Yes Respiratory Status: Nasal cannula History of Recent Intubation: Yes Length of Intubations (days): 2 days Date extubated: 09/20/15 Behavior/Cognition: Lethargic/Drowsy;Requires cueing;Doesn't follow directions Oral Cavity Assessment: Dry Oral Care Completed by SLP: Recent completion by staff Oral Cavity - Dentition: Adequate natural dentition Self-Feeding Abilities: Total assist Patient Positioning: Upright in bed Baseline Vocal Quality: Low vocal intensity;Breathy Volitional Cough: Cognitively unable to elicit Volitional Swallow: Unable to elicit    Oral/Motor/Sensory Function Overall Oral Motor/Sensory Function: Other (comment) (Unable to full assess due to difficulty following commands.)   Ice Chips Ice chips: Impaired Presentation: Spoon Oral Phase Impairments:  Reduced labial seal;Poor awareness of bolus Oral Phase Functional Implications: Prolonged oral transit;Oral holding Pharyngeal Phase Impairments: Suspected delayed Swallow;Decreased hyoid-laryngeal movement;Wet Vocal Quality;Cough - Delayed (mulitiple swallows to clear bolus.  )                                Shelly Flatten, MA, CCC-SLP Acute Rehab SLP [phone]  Shelly Flatten N 09/21/2015,8:02 AM

## 2015-09-21 NOTE — Clinical Social Work Note (Signed)
Clinical Social Work Assessment  Patient Details  Name: Benjamin Schroeder MRN: [id number] Date of Birth: 05-08-1946  Date of referral:  09/21/15               Reason for consult:  Facility Placement                Permission sought to share information with:  Chartered certified accountant granted to share information::  Yes, Verbal Permission Granted  Name::        Agency::     Relationship::     Contact Information:     Housing/Transportation Living arrangements for the past 2 months:  Single Family Home Source of Information:  Adult Children Patient Interpreter Needed:  None Criminal Activity/Legal Involvement Pertinent to Current Situation/Hospitalization:  No - Comment as needed Significant Relationships:  Adult Children Lives with:  Self Do you feel safe going back to the place where you live?  No Need for family participation in patient care:  No (Coment)  Care giving concerns:Pt lives alone and has no one to provide him with care at d/c. He will need to go to a SNF in order to get the care that he needs.  Social Worker assessment / plan:CSW spoke with pt's son, Marya Amsler, via phone re: role of CSW/d/c planning.  CSW confirmed that pt will need SNF at d/c and family prefers placement in the [place] area.  CSW will contact familys preferred facilities and secure bed offer on behalf of pt.  CSW will also coordinate pt's d/c to chosen facility as appropriate. Employment status:    Insurance information:  Medicare PT Recommendations:    Information / Referral to community resources:  [place]  Patient/Family's Response to care:  Agreeable to SNF placement in the [place] area. Patient/Family's Understanding of and Emotional Response to Diagnosis, Current Treatment, and Prognosis:  Pt's son realizes that pt cannot go home alone at d/c and will need rehab placement in order to increase his independence and ability to return home. Emotional  Assessment Appearance:  Appears stated age Attitude/Demeanor/Rapport:  Unable to Assess Affect (typically observed):  Unable to Assess Orientation:  Oriented to Self, Oriented to Place Alcohol / Substance use:    Psych involvement (Current and /or in the community):  No (Comment)  Discharge Needs  Concerns to be addressed:  Discharge Planning Concerns Readmission within the last 30 days:  No Current discharge risk:  None Barriers to Discharge:  No Barriers Identified   Kiandria Clum M, LCSW 09/21/2015, 4:18 PM

## 2015-09-21 NOTE — Progress Notes (Signed)
PT Cancellation Note  Patient Details Name: Yannis Sweetser MRN: [id number] DOB: January 05, 1946   Cancelled Treatment:    Reason Eval/Treat Not Completed: Patient at procedure or test/unavailable.  Pt awaiting transport to MRI.  PT will attempt to see pt again this afternoon, time permitting.   Joslyn Hy PT, DPT [phone] Pager: [phone] 09/21/2015, 9:27 AM

## 2015-09-21 NOTE — Progress Notes (Signed)
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  To OR 11/25 for L craniotomy.  Did well post op until 11/30 when he developed global aphasia and R hemiparesis as well as ?seizure activity and went back to OR for redo crani.  Post op was extubated initially but had deteriorating mental status and respiratory failure and PCCM was consulted for intubation and vent management.   SUBJECTIVE: No events overnight, remains extubated and protecting his airway but cough remains rather weak.  VITAL SIGNS: BP 145/55 mmHg  Pulse 100  Temp(Src) 98 F (36.7 C) (Axillary)  Resp 19  Ht 6' (1.829 m)  Wt 86.6 kg (190 lb 14.7 oz)  BMI 25.89 kg/m2  SpO2 98%  HEMODYNAMICS:    VENTILATOR SETTINGS: Vent Mode:  [-] PSV FiO2 (%):  [40 %] 40 % PEEP:  [5 cmH20] 5 cmH20 Pressure Support:  [5 cmH20] 5 cmH20  INTAKE / OUTPUT: I/O last 3 completed shifts: In: 2654.1 [I.V.:1091.1; Blood:281; NG/GT:720; IV Piggyback:562] Out: [id number] [Urine:3400; Drains:21; Stool:550]  PHYSICAL EXAMINATION: General:  Chronically ill appearing male, awake and following commands but lethargic Neuro:  Arousable moving L>R to command, weak on right. HEENT:  Mm moist, no JVD  Cardiovascular:  s1s2 rrr Lungs:  CTA bilaterally, clear over the larynx, no gurgling. Abdomen:  Round, soft, +bs  Musculoskeletal:  Warm and dry, no edema   LABS:  CBC  Recent Labs Lab 09/19/15 0419 09/20/15 0553 09/21/15 0342  WBC 2.4* 3.6* 3.2*  HGB 9.0* 10.5* 9.8*  HCT 25.9* 29.6* 29.2*  PLT 49* 59* 56*   Coag's  Recent Labs Lab 09/16/15 1554 09/18/15 0342  INR 1.33 1.20    BMET  Recent Labs Lab 09/19/15 0419 09/20/15 0308 09/21/15 0342  NA 135 135 143  K 4.7 6.0* 3.6  CL 108 109 113*  CO2 19* 18* 22  BUN 27* 33* 24*  CREATININE 1.33* 1.08 1.02  GLUCOSE 236* 145* 121*   Electrolytes  Recent Labs Lab 09/19/15 0419 09/20/15 0308 09/21/15 0342  CALCIUM 8.3* 8.2* 8.4*  MG 1.8 2.1 1.8  PHOS  --  2.3* 3.6   Sepsis Markers No results for input(s): LATICACIDVEN, PROCALCITON, O2SATVEN in the last 168 hours. ABG  Recent Labs Lab 09/18/15 1725 09/20/15 0404  PHART 7.417 7.437  PCO2ART 27.4* 28.3*  PO2ART 501* 158*   Liver Enzymes  Recent Labs Lab 09/16/15 1554 09/18/15 1831  AST 28 33  ALT 22 35  ALKPHOS 63  --   BILITOT 1.8*  --   ALBUMIN 2.8*  --    Cardiac Enzymes No results for input(s): TROPONINI, PROBNP in the last 168 hours. Glucose  Recent Labs Lab 09/20/15 1105 09/20/15 1640 09/20/15 1925 09/20/15 2353 09/21/15 0338 09/21/15 0721  GLUCAP 141* 163* 178* 140* 102* 141*   Imaging No results found. STUDIES:  CT head 11/25>>> acute on chronic moderate to large L SDH with 39mm left-right shift  CT head 11/30>>> Size stable left cerebral convexity subdural hematoma with 7 mm midline shift.  CULTURES: Sputum 11/30>>NTD  ANTIBIOTICS: None  SIGNIFICANT EVENTS: 11/25 admitted, SDH, to OR  11/30 aphasia,  R hemiparesis, ?seizure - back to OR for redo crani   LINES/TUBES: ETT 11/30>>>12/2  I reviewed CXR myself, clear.  DISCUSSION: 69 yo male with SDH in setting fall with chronic thrombocytopenia r/t cirrhosis.  S/p crani 11/25 with redo crani 11/30 after ?seizure activity and aphasia.   ASSESSMENT / PLAN:  NEUROLOGIC Large SDH - s/p crani 11/25 and redo 11/30 ?seziure  P:   MRI and EEG per neuro. Keppra, dilantin, decadron per nsgy   PULMONARY Acute respiratory failure  P:   Monitor closely for airway protection. Sputum culture negative. Hold off on abx for now - see ID.  CARDIOVASCULAR Hx HTN   CAD s/p CABG  P:  May restart ACE BP permits, hold for now. Goal SBP 120. PRN labetalol Continue atenolol.  RENAL Hyponatremia  CKD  Hyperkalemia improved P:   BMET in AM. Replace electrolytes as indicated. KVO IVF.  GASTROINTESTINAL ?hx Cirrhosis - ?NASH, no hx ETOH  P:   SLP failed, insert cortrak and restart TF. PPI   HEMATOLOGIC Thrombocytopenia - chronic  SDH Anemia  P:  SCD's  F/u CBC   INFECTIOUS No active issue  P:   Sputum culture with significant secretions negative. Trend wbc, fever curve.  ENDOCRINE DM P:   SSI  FAMILY  - Updates: Extubated, doing well, protecting airway, MRI and EEG per neurology, PCCM will sign off, please call back if needed.  Discussed with bedside RN.  Rush Farmer, M.D. [facility] Pulmonary/Critical Care Medicine. Pager: [phone]. After hours pager: [phone].

## 2015-09-21 NOTE — Progress Notes (Signed)
Bedside EEG completed, results pending. 

## 2015-09-21 NOTE — Progress Notes (Signed)
Hematology No significant rise in platelet count with pooled donor unit but count holding above 50,000 now P.O. Day 3. Minimal drainage 28 cc last 24 hours. I will stop AMICAR. Additional platelets if count falls below 50,000 but this will become less critical the further out he gets from surgery.

## 2015-09-22 ENCOUNTER — Inpatient Hospital Stay (HOSPITAL_COMMUNITY): Payer: Medicare Other

## 2015-09-22 DIAGNOSIS — G9340 Encephalopathy, unspecified: Secondary | ICD-10-CM

## 2015-09-22 DIAGNOSIS — G20 Parkinson's disease: Secondary | ICD-10-CM

## 2015-09-22 DIAGNOSIS — I6789 Other cerebrovascular disease: Secondary | ICD-10-CM

## 2015-09-22 LAB — GLUCOSE, CAPILLARY
GLUCOSE-CAPILLARY: 146 mg/dL — AB (ref 65–99)
GLUCOSE-CAPILLARY: 233 mg/dL — AB (ref 65–99)
GLUCOSE-CAPILLARY: 203 mg/dL — AB (ref 65–99)
Glucose-Capillary: 240 mg/dL — ABNORMAL HIGH (ref 65–99)
Glucose-Capillary: 219 mg/dL — ABNORMAL HIGH (ref 65–99)

## 2015-09-22 MED ORDER — IOHEXOL 350 MG/ML SOLN
100.0000 mL | Freq: Once | INTRAVENOUS | Status: AC | PRN
  Administered 2015-09-22: 100 mL via INTRAVENOUS

## 2015-09-22 MED ORDER — CHLORHEXIDINE GLUCONATE 0.12 % MT SOLN
OROMUCOSAL | Status: AC
Start: 2015-09-22 — End: 2015-09-22
  Filled 2015-09-22: qty 15

## 2015-09-22 NOTE — Progress Notes (Signed)
Hematology: Surgical drain removed.Platlet count holding at 56,000. No new rec. Likey follow up CT today.

## 2015-09-22 NOTE — Progress Notes (Signed)
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 He had seizures 4 days ago and currently on Keppra 1 g twice a day, and Dilantin 100 mg every 8 hours. He did not not have any further clinical seizures.   He had a repeat EEG done yesterday which showed evidence of moderate encephalopathy, but no definite evidence of any abnormal epileptiform discharges.  CT angiogram of the head and neck, perfusion studies were ordered to evaluate for any left hemispheric perfusion deficits to account for the continued right hemiparesis. Tiny areas of acute infarcts are seen in the left MCA territory on brain MRI.  CT studies were reported to show the following;  CT HEAD: Status post recent partial evacuation of LEFT holohemispheric subdural hematoma, similar in size resulting in stable 3 mm LEFT-to-RIGHT midline shift. No rebleed.  No acute large vascular territory infarct.  CT PERFUSION: Motion degraded examination, no convincing evidence of perfusion abnormality. Please note, artifact from LEFT subdural hematoma.  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 Less than 50% stenosis RIGHT ICA origin.  Mild stenosis LEFT vertebral artery origin. Mild stenosis bilateral subclavian artery due to calcific atherosclerosis.  CTA HEAD: No acute large vessel occlusion or high-grade stenosis. Moderate stenosis RIGHT supraclinoid internal carotid artery.   Current facility-administered medications:  .  acetaminophen (TYLENOL) tablet 650 mg, 650 mg, Oral, Q4H PRN, 650 mg at 09/21/15 2247 **OR** acetaminophen (TYLENOL) suppository 650 mg, 650 mg, Rectal, Q4H PRN, Eustace Moore, MD .  amantadine [facility]) solution 100 mg, 100 mg, Per NG tube, BID, Thanvi Blincoe Fuller Mandril, MD, 100 mg at 09/22/15 1600 .  antiseptic oral rinse solution (CORINZ), 7 mL, Mouth Rinse, 10 times  per day, Eustace Moore, MD, 7 mL at 09/22/15 2058 .  atenolol (TENORMIN) tablet 25 mg, 25 mg, Oral, Daily, Eustace Moore, MD, 25 mg at 09/22/15 1043 .  chlorhexidine gluconate (PERIDEX) 0.12 % solution 15 mL, 15 mL, Mouth Rinse, BID, Eustace Moore, MD, 15 mL at 09/22/15 1957 .  feeding supplement (PRO-STAT SUGAR FREE 64) liquid 30 mL, 30 mL, Per Tube, TID, [name], RD, 30 mL at 09/22/15 2058 .  feeding supplement (VITAL AF 1.2 CAL) liquid 1,000 mL, 1,000 mL, Per Tube, Continuous, [name], RD, Stopped at 09/22/15 1827 .  fentaNYL (SUBLIMAZE) injection 50 mcg, 50 mcg, Intravenous, Q15 min PRN, [name], NP .  fentaNYL (SUBLIMAZE) injection 50 mcg, 50 mcg, Intravenous, Q2H PRN, [name], NP .  ferrous sulfate tablet 325 mg, 325 mg, Oral, Q breakfast, Eustace Moore, MD, 325 mg at 09/22/15 [id number] .  insulin aspart (novoLOG) injection 0-15 Units, 0-15 Units, Subcutaneous, 6 times per day, [name], NP, 5 Units at 09/22/15 1957 .  labetalol (NORMODYNE,TRANDATE) injection 10-40 mg, 10-40 mg, Intravenous, Q10 min PRN, Eustace Moore, MD, 20 mg at 09/21/15 2048 .  levETIRAcetam (KEPPRA) 1,000 mg in sodium chloride 0.9 % 100 mL IVPB, 1,000 mg, Intravenous, Q12H, Jacqueline Delapena Fuller Mandril, MD, 1,000 mg at 09/22/15 1038 .  midazolam (VERSED) injection 1 mg, 1 mg, Intravenous, Q15 min PRN, [name], NP .  midazolam (VERSED) injection 1 mg, 1 mg, Intravenous, Q2H PRN, [name], NP .  ondansetron (ZOFRAN) tablet 4 mg, 4 mg, Oral, Q4H PRN **OR** ondansetron (ZOFRAN) injection 4 mg, 4 mg, Intravenous, Q4H PRN, Eustace Moore, MD .  pantoprazole (PROTONIX) injection 40 mg, 40 mg, Intravenous, QHS, Eustace Moore, MD, 40 mg at 09/22/15 2057 .  phenytoin (DILANTIN) injection 100 mg, 100 mg, Intravenous, 3 times per day, Manuella Blackson Fuller Mandril, MD, 100 mg at 09/22/15 2057 .  promethazine (PHENERGAN) tablet 12.5-25 mg, 12.5-25 mg, Oral, Q4H PRN,  Eustace Moore, MD .  propofol (DIPRIVAN) 1000 MG/100ML infusion, 5-80 mcg/kg/min, Intravenous, Titrated, [name], MD, Last Rate: 5 mL/hr at 09/20/15 0700, 9.944 mcg/kg/min at 09/20/15 0700    Exam: Filed Vitals:   09/22/15 2100 09/22/15 2300  BP: 162/67 146/57  Pulse: 106 101  Temp:    Resp: 19 20   Patient is drowsy, but easily arousable. He follows simple commands, able to sustain antigravity in the left upper and lower extremities, has some unsustained movement in the right upper and lower extremities today which is a slight improvement from yesterday.  Midline gaze, no specific gaze deviation or preference. Pupils equal and reactive. rigidity in the left upper extremity has improved with amantadine .     Impression:  69 year old male patient with a left cerebral convex to acute on chronic subdural hematoma, status post evacuation with craniectomy 4 days ago, status post seizures secondary to subdural hematoma. He did not have any further clinical seizures. Currently on Keppra 1 g twice a day and Dilantin 100 mg 3 times a day. Recommend to continue the same.  Repeat EEG done yesterday did not show any abnormal Discharges. Evidence of moderate encephalopathy was seen on the EEG. Patient is very drowsy yesterday at the level of alertness appear to have improved today, he is easily arousable and able to follow simple commands better.   brain MRI  showed few small tiny areas of acute restricted diffusion in the left MCA vascular territory and the insular cortex and the parietal cortex. Given the small size of these acute areas of restricted diffusion, it would be difficult to believe that they are causing the significant right sided weakness noted on exam. No evidence of seizures or continued abnormal discharges were seen on the EEG.  To further evaluate for any left MCA vascular abnormality or perfusion deficits, CT angiogram of the head and neck with a perfusion study was ordered  for further evaluation.  CT studies were reported to show the following;  CT HEAD: Status post recent partial evacuation of LEFT holohemispheric subdural hematoma, similar in size resulting in stable 3 mm LEFT-to-RIGHT midline shift. No rebleed. No acute large vascular territory infarct. CT PERFUSION: Motion degraded examination, no convincing evidence of perfusion abnormality. Please note, artifact from LEFT subdural hematoma. 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 Less than 50% stenosis RIGHT ICA origin. Mild stenosis LEFT vertebral artery origin. Mild stenosis bilateral subclavian artery due to calcific atherosclerosis. CTA HEAD: No acute large vessel occlusion or high-grade stenosis . Moderate stenosis RIGHT supraclinoid internal carotid artery.   We'll order an echocardiogram with bubble study to complete the stroke workup to evaluate for any cardiac source of thrombus, caused these tiny areas of restricted diffusion.  He is currently not on any antiplatelet agents due to the acute subdural hematoma.   rigidity in the left upper extremity likely secondary to his baseline Parkinson's disease; has improved with amantadine 100 mg twice a day at 8 AM and 1 PM. Recommend to continue same.   Neurology stroke team will continue to follow up . Please call for any further questions.

## 2015-09-22 NOTE — Evaluation (Signed)
Physical Therapy Evaluation Patient Details Name: Fransisco Mooers MRN: [id number] DOB: May 30, 1946 Today's Date: 09/22/2015   History of Present Illness  Pt is a 2 male who suffered a mechanical fall 3-4 weeks ago who came to ED with HA on 11/25. Pt found to have L SDH. Pt s/p L craniotomy for evactuation of subdural hematoma on 11/25. Underwent craniectomy on 11/30.    Clinical Impression  Mr. Chuang was more alert today and participated in initiating all aspects of bed mobility. Pt able to sit EOB w/ close min guard assist for ~5 sec, otherwise minA required. Pt became incontinent of bowel sitting EOB and was returned to supine for pericare.  Pt will benefit from continued skilled PT services to increase functional independence and safety.    Follow Up Recommendations SNF    Equipment Recommendations  None recommended by PT    Recommendations for Other Services       Precautions / Restrictions Precautions Precautions: Fall Restrictions Weight Bearing Restrictions: No      Mobility  Bed Mobility Overal bed mobility: Needs Assistance;+2 for physical assistance Bed Mobility: Rolling;Sidelying to Sit Rolling: Mod assist;+2 for physical assistance Sidelying to sit: Mod assist;+2 for physical assistance;HOB elevated       General bed mobility comments: A for bil LEs and at trunk to push up from sitting.  Verbal and tactile cues needed.  Transfers                 General transfer comment: unable to attempt as pt became incontinent of bowel while sitting EOB and was returned to supine for pericare  Ambulation/Gait                Stairs            Wheelchair Mobility    Modified Rankin (Stroke Patients Only) Modified Rankin (Stroke Patients Only) Pre-Morbid Rankin Score: No symptoms Modified Rankin: Severe disability     Balance Overall balance assessment: Needs assistance Sitting-balance support: Feet supported;Single extremity supported Sitting  balance-Leahy Scale: Fair Sitting balance - Comments: pt leans to Lt and requires verbal, visual, and tactile cues to correct. Is able to sit w/ close min guard for ~5 sec, otherwise requiring minA Postural control: Left lateral lean                                   Pertinent Vitals/Pain Pain Assessment: No/denies pain Faces Pain Scale: No hurt Pain Intervention(s): Limited activity within patient's tolerance;Monitored during session    Home Living                        Prior Function                 Hand Dominance        Extremity/Trunk Assessment                         Communication      Cognition Arousal/Alertness: Awake/alert Behavior During Therapy: Flat affect Overall Cognitive Status: Difficult to assess Area of Impairment: Following commands;Problem solving;Orientation;Attention;Safety/judgement;Awareness Orientation Level: Time;Situation Current Attention Level: Focused   Following Commands: Follows one step commands inconsistently;Follows one step commands with increased time Safety/Judgement: Decreased awareness of safety;Decreased awareness of deficits Awareness: Intellectual Problem Solving: Requires verbal cues;Requires tactile cues General Comments: Pt able to mumble his name and can report this son and  son's wife are in room.  However, is unable to determine where he is and is easily distracted by lines during bed mobility.    General Comments General comments (skin integrity, edema, etc.): VSS    Exercises Other Exercises Other Exercises: Pt able to initiate pushing up from leaning on Lt and Rt elbow, minA to achieve midline      Assessment/Plan    PT Assessment    PT Diagnosis     PT Problem List    PT Treatment Interventions     PT Goals (Current goals can be found in the Care Plan section) Acute Rehab PT Goals Patient Stated Goal: not stated PT Goal Formulation: Patient unable to participate in  goal setting Time For Goal Achievement: 09/29/15 Potential to Achieve Goals: Good    Frequency Min 3X/week   Barriers to discharge        Co-evaluation               End of Session   Activity Tolerance: Patient tolerated treatment well Patient left: with call bell/phone within reach;in bed Nurse Communication: Mobility status;Other (comment) (pt continues to have BM, nurse tech and RN aware)         Time: [phone] (pt not charged for 10 mins spent for pericare) PT Time Calculation (min) (ACUTE ONLY): 33 min   Charges:     PT Treatments $Therapeutic Activity: 8-22 mins   PT G Codes:       Joslyn Hy PT, DPT [phone] Pager: [phone] 09/22/2015, 12:29 PM

## 2015-09-23 ENCOUNTER — Inpatient Hospital Stay (HOSPITAL_COMMUNITY): Payer: Medicare Other

## 2015-09-23 DIAGNOSIS — I6789 Other cerebrovascular disease: Secondary | ICD-10-CM

## 2015-09-23 DIAGNOSIS — R569 Unspecified convulsions: Secondary | ICD-10-CM

## 2015-09-23 LAB — PHENYTOIN LEVEL, TOTAL: Phenytoin Lvl: 9.1 ug/mL — ABNORMAL LOW (ref 10.0–20.0)

## 2015-09-23 LAB — LIPID PANEL
CHOL/HDL RATIO: 4.8 ratio
CHOLESTEROL: 129 mg/dL (ref 0–200)
HDL: 27 mg/dL — ABNORMAL LOW (ref >40)
LDL Cholesterol: 85 mg/dL (ref 0–99)
TRIGLYCERIDES: 87 mg/dL (ref <150)
VLDL: 17 mg/dL (ref 0–40)

## 2015-09-23 LAB — CBC WITH DIFFERENTIAL/PLATELET
BASOS ABS: 0 10*3/uL (ref 0.0–0.1)
BASOS PCT: 0 %
EOS ABS: 0 10*3/uL (ref 0.0–0.7)
EOS PCT: 1 %
HEMATOCRIT: 32.3 % — AB (ref 39.0–52.0)
Hemoglobin: 11.2 g/dL — ABNORMAL LOW (ref 13.0–17.0)
LYMPHS ABS: 0.6 10*3/uL — AB (ref 0.7–4.0)
LYMPHS PCT: 11 %
MCH: 33.9 pg (ref 26.0–34.0)
MCHC: 34.7 g/dL (ref 30.0–36.0)
MCV: 97.9 fL (ref 78.0–100.0)
Monocytes Absolute: 0.4 10*3/uL (ref 0.1–1.0)
Monocytes Relative: 7 %
NEUTROS PCT: 82 %
Neutro Abs: 4.5 10*3/uL (ref 1.7–7.7)
PLATELETS: 83 10*3/uL — AB (ref 150–400)
RBC: 3.3 MIL/uL — ABNORMAL LOW (ref 4.22–5.81)
RDW: 12.9 % (ref 11.5–15.5)
WBC: 5.5 10*3/uL (ref 4.0–10.5)

## 2015-09-23 LAB — GLUCOSE, CAPILLARY
GLUCOSE-CAPILLARY: 197 mg/dL — AB (ref 65–99)
GLUCOSE-CAPILLARY: 199 mg/dL — AB (ref 65–99)
Glucose-Capillary: 164 mg/dL — ABNORMAL HIGH (ref 65–99)
Glucose-Capillary: 199 mg/dL — ABNORMAL HIGH (ref 65–99)
Glucose-Capillary: 199 mg/dL — ABNORMAL HIGH (ref 65–99)
Glucose-Capillary: 229 mg/dL — ABNORMAL HIGH (ref 65–99)

## 2015-09-23 MED ORDER — PRO-STAT SUGAR FREE PO LIQD: 30.0000 mL | Freq: Every day | ORAL | Status: DC

## 2015-09-23 MED ORDER — ANTISEPTIC ORAL RINSE SOLUTION (CORINZ)
7.0000 mL | Freq: Two times a day (BID) | OROMUCOSAL | Status: DC
  Administered 2015-09-24 – 2015-09-28 (×8): 7 mL via OROMUCOSAL

## 2015-09-23 MED ORDER — LEVETIRACETAM 500 MG PO TABS
1000.0000 mg | ORAL_TABLET | Freq: Two times a day (BID) | ORAL | Status: DC
  Administered 2015-09-23: 1000 mg via ORAL
  Filled 2015-09-23: qty 2

## 2015-09-23 MED ORDER — PHENYTOIN SODIUM 50 MG/ML IJ SOLN
100.0000 mg | Freq: Three times a day (TID) | INTRAMUSCULAR | Status: DC
  Administered 2015-09-23 – 2015-09-26 (×8): 100 mg via INTRAVENOUS
  Filled 2015-09-23 (×11): qty 2

## 2015-09-23 MED ORDER — ANTISEPTIC ORAL RINSE SOLUTION (CORINZ)
7.0000 mL | Freq: Four times a day (QID) | OROMUCOSAL | Status: DC
  Administered 2015-09-23: 7 mL via OROMUCOSAL

## 2015-09-23 MED ORDER — SIMVASTATIN 40 MG PO TABS
40.0000 mg | ORAL_TABLET | Freq: Every day | ORAL | Status: DC
  Administered 2015-09-23 – 2015-09-27 (×5): 40 mg via ORAL
  Filled 2015-09-23 (×5): qty 1

## 2015-09-23 MED ORDER — PHENYTOIN 50 MG PO CHEW
100.0000 mg | CHEWABLE_TABLET | Freq: Three times a day (TID) | ORAL | Status: DC
  Filled 2015-09-23: qty 2

## 2015-09-23 MED ORDER — JEVITY 1.2 CAL PO LIQD
1000.0000 mL | ORAL | Status: DC
  Administered 2015-09-23: 1000 mL
  Filled 2015-09-23 (×4): qty 1000

## 2015-09-23 NOTE — Progress Notes (Signed)
Nutrition Follow-up  INTERVENTION:   D/C Vital AF 1.2  Start Jevity 1.2 @ 50 ml/hr 30 ml Prostat daily  Provides: 1972 kcal (100% of needs), 101 grams protein, and 1265 ml H2O.    NUTRITION DIAGNOSIS:   Inadequate oral intake related to inability to eat as evidenced by NPO status. Ongoing.   GOAL:   Patient will meet greater than or equal to 90% of their needs Met.   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 To OR 11/25 for L craniotomy. Did well post op until 11/30 when he developed global aphasia and R hemiparesis as well as ?seizure activity and went back to OR for redo crani. Post op was extubated initially but had deteriorating mental status and respiratory failure .  Pt extubated 12/2 Failed swallow eval and remains on TF via Cortrak tube (tip in distal duodenum)  Per SLP hopeful to be able to swallow tomorrow Pt discussed during ICU rounds and with RN.  Medications reviewed and include: ferrous sulfate CBG's: 164-229  Diet Order:  Diet NPO time specified  Skin:  Reviewed, no issues  Last BM:  12/5 diarrhea  Height:   Ht Readings from Last 1 Encounters:  09/18/15 6' (1.829 m)   Weight:   Wt Readings from Last 1 Encounters:  09/23/15 172 lb 6.4 oz (78.2 kg)   Ideal Body Weight:  80.9 kg  BMI:  Body mass index is 23.38 kg/(m^2).  Estimated Nutritional Needs:   Kcal:  1900-2100  Protein:  90-105 grams  Fluid:  > 1.9 L/day  EDUCATION NEEDS:   No education needs identified at this time  [place], [place], [place] Pager [phone] After Hours Pager

## 2015-09-23 NOTE — Progress Notes (Signed)
Speech Language Pathology Treatment: Dysphagia;Cognitive-Linquistic  Patient Details Name: Jemere Sancho MRN: [id number] DOB: Jun 14, 1946 Today's Date: 09/23/2015 Time: [id number] SLP Time Calculation (min) (ACUTE ONLY): 27 min  Assessment / Plan / Recommendation Clinical Impression  Dysphagia: Diagnostic dysphagia treatment complete. SLP provided therapeutic/diagnostic po trials to assess readiness for a po diet. Patient much more alert than during session 12/4 with ability to attend to task without cueing. Oral motor exam appearing WFL with the exception of generalized weakness. Patient initially able to consume thin liquids, pureed solids with seemingly intact oropharyngeal swallowing abilities and no overt indication of aspiration. As trials progressed however, patient with indication of a suspected primary GI component with potential pharyngeal subset characterized by frequent belching, multiple swallows, wet vocal quality, and throat clearing. Y/N questions unreliable in achieving accurate picture of abilities. Po trials stopped. Highly suspect that dysfunction primarily GI in nature today and that with resolve, patient will be able to safely resume a po diet. Will plan to f/u 12/6 for continued diagnostic treatment.   Cognition/Language: Treatment focused on previous aphasia goals with focus of previously noted severe global aphasia. Overall, language abilities much improved with ability to answer basic biographical Y/N questions with 60% accuracy and follow basic 1-step commands with 75% accuracy to target receptive language. Patient able to name common objects today with 100% accuracy (although quantity limited) and utilize basic familiar phrases in conversation with moderate questioning cues targeting expressive language abilities. Oriented to self only independently. Moderate verbal cueing provided to direct to accurate hospital name. Given great improvement since initial evaluation, will update  goals accordingly and continue to f/u.    HPI HPI: Pt is a 18 male who suffered a mechanical fall 3-4 weeks ago who came to ED with HA on 11/25. Pt found to have L SDH. Pt s/p L craniotomy for evactuation of subdural hematoma on 11/25. He was taken back to the OR to redo his initial crani.  Post op he was initially extubated but had to be re-intubated due to deteriorating mental status.        SLP Plan  Goals updated (cognitive-linguistic goals updated)     Recommendations  Diet recommendations: NPO Medication Administration: Via alternative means              Oral Care Recommendations: Oral care QID Follow up Recommendations: Inpatient Rehab Plan: Goals updated (cognitive-linguistic goals updated)  Gabriel Rainwater [place], [place] [phone]  Tyren Dugar Meryl 09/23/2015, 10:24 AM

## 2015-09-23 NOTE — Progress Notes (Signed)
Patient ID: Benjamin Schroeder, male   DOB: [birth year], 68 y.o.   MRN: [id number] Subjective: Patient looks better. He is lethargi but arousable, FC, MAE, names objects  Objective: Vital signs in last 24 hours: Temp:  [97.2 F (36.2 C)-99.2 F (37.3 C)] 97.2 F (36.2 C) (12/05 1126) Pulse Rate:  [84-106] 84 (12/05 1200) Resp:  [15-24] 16 (12/05 1200) BP: (133-168)/(53-68) 133/57 mmHg (12/05 1200) SpO2:  [95 %-99 %] 97 % (12/05 1200) Weight:  [78.2 kg (172 lb 6.4 oz)] 78.2 kg (172 lb 6.4 oz) (12/05 0500)  Intake/Output from previous day: 12/04 0701 - 12/05 0700 In: 1190.3 [NG/GT:970.3; IV Piggyback:220] Out: 2275 [Urine:2075; Stool:200] Intake/Output this shift: Total I/O In: 380 [NG/GT:270; IV Piggyback:110] Out: 355 [Urine:355]  as above  Lab Results: Lab Results  Component Value Date   WBC 3.2* 09/21/2015   HGB 9.8* 09/21/2015   HCT 29.2* 09/21/2015   MCV 98.6 09/21/2015   PLT 56* 09/21/2015   Lab Results  Component Value Date   INR 1.20 09/18/2015   BMET Lab Results  Component Value Date   NA 143 09/21/2015   K 3.6 09/21/2015   CL 113* 09/21/2015   CO2 22 09/21/2015   GLUCOSE 121* 09/21/2015   BUN 24* 09/21/2015   CREATININE 1.02 09/21/2015   CALCIUM 8.4* 09/21/2015    Studies/Results: Ct Angio Head W/cm &/or Wo Cm  09/22/2015  CLINICAL DATA:  Mechanical fall 3-4 weeks ago, status post subdural hematoma evacuation September 13, 2015 and redo craniotomy September 18, 2015. Encephalopathy. Assess LEFT stroke. History of diabetes, cirrhosis, recurrent blood transfusion. EXAM: CT CEREBRAL PERFUSION WITH CONTRAST; CT ANGIOGRAPHY HEAD; CT ANGIOGRAPHY NECK TECHNIQUE: Multidetector CT imaging of the brain was performed during bolus injection of intravenous contrast. Multiplanar CT angiographic image reconstructions including MIPs were generated to evaluate cerebral vasculature centered at the Circle of [place].; Multidetector CT imaging of the neck was performed during bolus  injection of intravenous contrast. Multiplanar CT angiographic image reconstructions including MIPs were generated to evaluate the extra-cranial carotid arteries. CONTRAST:  124mL OMNIPAQUE IOHEXOL 350 MG/ML SOLN COMPARISON:  MRI of the brain September 21, 2015 FINDINGS: CT HEAD: Recent partial evacuation of LEFT holohemispheric acute to subacute subdural hematoma measuring up to 13 mm in transaxial dimension. Extra-axial pneumocephalus and probable Surgicel within the subdural space. 3 mm LEFT-to-RIGHT midline shift, relatively unchanged. Partially effaced LEFT lateral ventricle is similar, no ventricular entrapment or hydrocephalus. No intraparenchymal hemorrhage. No acute large vascular territory infarct. Basal cisterns are patent. Small LEFT middle cranial fossa arachnoid cyst without mass effect. No abnormal intracranial enhancement. Moderate to severe calcific atherosclerosis the carotid siphons. Ocular globes and orbital contents are unremarkable. LEFT frontal craniotomy, with overlying scalp hematoma and skin staples. Mild paranasal sinus mucosal thickening. Small RIGHT mastoid effusion. Life-support lines in place. CTA PERFUSION: Motion degraded examination. No convincing evidence of perfusion mismatch. Artifact related to LEFT subdural hematoma and extra-axial pneumocephalus. CTA NECK Normal appearance of the thoracic arch, normal branch pattern. Mild calcific atherosclerosis and stenosis LEFT Common carotid artery origin. Calcific atherosclerosis of the LEFT subclavian artery results in mild stenosis though, limited by patient motion. Calcific atherosclerosis resulting in mild stenosis proximal RIGHT subclavian artery. Eccentric calcific atherosclerosis of the RIGHT Common carotid artery resulting in mild stenosis. Eccentric calcific atherosclerosis of RIGHT carotid bifurcation extent internal carotid artery with less than 50% stenosis by NASCET criteria. Remainder RIGHT internal carotid artery widely  patent. Eccentric intimal thickening and calcific atherosclerosis results in mild stenosis mid LEFT  Common carotid artery. Eccentric calcific atherosclerosis of the LEFT carotid bifurcation extending to internal carotid artery 70% stenosis LEFT internal carotid artery origin by NASCET criteria. LEFT vertebral artery is dominant. Mild stenosis of LEFT vertebral artery origin due to calcific atherosclerosis. Origin of the RIGHT vertebral artery is widely patent. No dissection, no pseudoaneurysm. No abnormal luminal irregularity. No contrast extravasation. Soft tissues are nonsuspicious. Nasogastric tube in place. Grade 1 C3-4 anterolisthesis on degenerative basis associated with severe LEFT facet arthropathy. No acute osseous process though bone windows have not been submitted. Old median sternotomy. CTA HEAD Anterior circulation: Patent cervical internal carotid arteries, petrous, cavernous and supra clinoid internal carotid arteries. Calcific atherosclerosis results in moderate stenosis RIGHT supraclinoid internal carotid artery. Widely patent anterior communicating artery. Normal appearance of the anterior and middle cerebral arteries. Posterior circulation: LEFT vertebral artery is dominant with normal appearance of the vertebral arteries, vertebrobasilar junction and basilar artery, as well as main branch vessels. Moderate calcific atherosclerosis of LEFT V4 segment. Normal appearance of the posterior cerebral arteries. No large vessel occlusion, hemodynamically significant stenosis, dissection, luminal irregularity, contrast extravasation or aneurysm within the anterior nor posterior circulation. IMPRESSION: CT HEAD: Status post recent partial evacuation of LEFT holohemispheric subdural hematoma, similar in size resulting in stable 3 mm LEFT-to-RIGHT midline shift. No rebleed. No acute large vascular territory infarct. CT PERFUSION: Motion degraded examination, no convincing evidence of perfusion abnormality.  Please note, artifact from LEFT subdural hematoma. 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 Less than 50% stenosis RIGHT ICA origin. Mild stenosis LEFT vertebral artery origin. Mild stenosis bilateral subclavian artery due to calcific atherosclerosis. CTA HEAD: No acute large vessel occlusion or high-grade stenosis. Moderate stenosis RIGHT supraclinoid internal carotid artery. Electronically Signed   By: Elon Alas M.D.   On: 09/22/2015 23:39   Ct Angio Neck W/cm &/or Wo/cm  09/22/2015  CLINICAL DATA:  Mechanical fall 3-4 weeks ago, status post subdural hematoma evacuation September 13, 2015 and redo craniotomy September 18, 2015. Encephalopathy. Assess LEFT stroke. History of diabetes, cirrhosis, recurrent blood transfusion. EXAM: CT CEREBRAL PERFUSION WITH CONTRAST; CT ANGIOGRAPHY HEAD; CT ANGIOGRAPHY NECK TECHNIQUE: Multidetector CT imaging of the brain was performed during bolus injection of intravenous contrast. Multiplanar CT angiographic image reconstructions including MIPs were generated to evaluate cerebral vasculature centered at the [place] of [place].; Multidetector CT imaging of the neck was performed during bolus injection of intravenous contrast. Multiplanar CT angiographic image reconstructions including MIPs were generated to evaluate the extra-cranial carotid arteries. CONTRAST:  128mL OMNIPAQUE IOHEXOL 350 MG/ML SOLN COMPARISON:  MRI of the brain September 21, 2015 FINDINGS: CT HEAD: Recent partial evacuation of LEFT holohemispheric acute to subacute subdural hematoma measuring up to 13 mm in transaxial dimension. Extra-axial pneumocephalus and probable Surgicel within the subdural space. 3 mm LEFT-to-RIGHT midline shift, relatively unchanged. Partially effaced LEFT lateral ventricle is similar, no ventricular entrapment or hydrocephalus. No intraparenchymal hemorrhage. No acute large vascular territory infarct. Basal cisterns are patent. Small LEFT middle  cranial fossa arachnoid cyst without mass effect. No abnormal intracranial enhancement. Moderate to severe calcific atherosclerosis the carotid siphons. Ocular globes and orbital contents are unremarkable. LEFT frontal craniotomy, with overlying scalp hematoma and skin staples. Mild paranasal sinus mucosal thickening. Small RIGHT mastoid effusion. Life-support lines in place. CTA PERFUSION: Motion degraded examination. No convincing evidence of perfusion mismatch. Artifact related to LEFT subdural hematoma and extra-axial pneumocephalus. CTA NECK Normal appearance of the thoracic arch, normal branch pattern. Mild calcific atherosclerosis  and stenosis LEFT Common carotid artery origin. Calcific atherosclerosis of the LEFT subclavian artery results in mild stenosis though, limited by patient motion. Calcific atherosclerosis resulting in mild stenosis proximal RIGHT subclavian artery. Eccentric calcific atherosclerosis of the RIGHT Common carotid artery resulting in mild stenosis. Eccentric calcific atherosclerosis of RIGHT carotid bifurcation extent internal carotid artery with less than 50% stenosis by NASCET criteria. Remainder RIGHT internal carotid artery widely patent. Eccentric intimal thickening and calcific atherosclerosis results in mild stenosis mid LEFT Common carotid artery. Eccentric calcific atherosclerosis of the LEFT carotid bifurcation extending to internal carotid artery 70% stenosis LEFT internal carotid artery origin by NASCET criteria. LEFT vertebral artery is dominant. Mild stenosis of LEFT vertebral artery origin due to calcific atherosclerosis. Origin of the RIGHT vertebral artery is widely patent. No dissection, no pseudoaneurysm. No abnormal luminal irregularity. No contrast extravasation. Soft tissues are nonsuspicious. Nasogastric tube in place. Grade 1 C3-4 anterolisthesis on degenerative basis associated with severe LEFT facet arthropathy. No acute osseous process though bone windows have  not been submitted. Old median sternotomy. CTA HEAD Anterior circulation: Patent cervical internal carotid arteries, petrous, cavernous and supra clinoid internal carotid arteries. Calcific atherosclerosis results in moderate stenosis RIGHT supraclinoid internal carotid artery. Widely patent anterior communicating artery. Normal appearance of the anterior and middle cerebral arteries. Posterior circulation: LEFT vertebral artery is dominant with normal appearance of the vertebral arteries, vertebrobasilar junction and basilar artery, as well as main branch vessels. Moderate calcific atherosclerosis of LEFT V4 segment. Normal appearance of the posterior cerebral arteries. No large vessel occlusion, hemodynamically significant stenosis, dissection, luminal irregularity, contrast extravasation or aneurysm within the anterior nor posterior circulation. IMPRESSION: CT HEAD: Status post recent partial evacuation of LEFT holohemispheric subdural hematoma, similar in size resulting in stable 3 mm LEFT-to-RIGHT midline shift. No rebleed. No acute large vascular territory infarct. CT PERFUSION: Motion degraded examination, no convincing evidence of perfusion abnormality. Please note, artifact from LEFT subdural hematoma. 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 Less than 50% stenosis RIGHT ICA origin. Mild stenosis LEFT vertebral artery origin. Mild stenosis bilateral subclavian artery due to calcific atherosclerosis. CTA HEAD: No acute large vessel occlusion or high-grade stenosis. Moderate stenosis RIGHT supraclinoid internal carotid artery. Electronically Signed   By: Elon Alas M.D.   On: 09/22/2015 23:39   Ct Cerebral Perfusion W/cm  09/22/2015  CLINICAL DATA:  Mechanical fall 3-4 weeks ago, status post subdural hematoma evacuation September 13, 2015 and redo craniotomy September 18, 2015. Encephalopathy. Assess LEFT stroke. History of diabetes, cirrhosis, recurrent blood  transfusion. EXAM: CT CEREBRAL PERFUSION WITH CONTRAST; CT ANGIOGRAPHY HEAD; CT ANGIOGRAPHY NECK TECHNIQUE: Multidetector CT imaging of the brain was performed during bolus injection of intravenous contrast. Multiplanar CT angiographic image reconstructions including MIPs were generated to evaluate cerebral vasculature centered at the Circle of [place].; Multidetector CT imaging of the neck was performed during bolus injection of intravenous contrast. Multiplanar CT angiographic image reconstructions including MIPs were generated to evaluate the extra-cranial carotid arteries. CONTRAST:  165mL OMNIPAQUE IOHEXOL 350 MG/ML SOLN COMPARISON:  MRI of the brain September 21, 2015 FINDINGS: CT HEAD: Recent partial evacuation of LEFT holohemispheric acute to subacute subdural hematoma measuring up to 13 mm in transaxial dimension. Extra-axial pneumocephalus and probable Surgicel within the subdural space. 3 mm LEFT-to-RIGHT midline shift, relatively unchanged. Partially effaced LEFT lateral ventricle is similar, no ventricular entrapment or hydrocephalus. No intraparenchymal hemorrhage. No acute large vascular territory infarct. Basal cisterns are patent. Small LEFT middle cranial  fossa arachnoid cyst without mass effect. No abnormal intracranial enhancement. Moderate to severe calcific atherosclerosis the carotid siphons. Ocular globes and orbital contents are unremarkable. LEFT frontal craniotomy, with overlying scalp hematoma and skin staples. Mild paranasal sinus mucosal thickening. Small RIGHT mastoid effusion. Life-support lines in place. CTA PERFUSION: Motion degraded examination. No convincing evidence of perfusion mismatch. Artifact related to LEFT subdural hematoma and extra-axial pneumocephalus. CTA NECK Normal appearance of the thoracic arch, normal branch pattern. Mild calcific atherosclerosis and stenosis LEFT Common carotid artery origin. Calcific atherosclerosis of the LEFT subclavian artery results in mild  stenosis though, limited by patient motion. Calcific atherosclerosis resulting in mild stenosis proximal RIGHT subclavian artery. Eccentric calcific atherosclerosis of the RIGHT Common carotid artery resulting in mild stenosis. Eccentric calcific atherosclerosis of RIGHT carotid bifurcation extent internal carotid artery with less than 50% stenosis by NASCET criteria. Remainder RIGHT internal carotid artery widely patent. Eccentric intimal thickening and calcific atherosclerosis results in mild stenosis mid LEFT Common carotid artery. Eccentric calcific atherosclerosis of the LEFT carotid bifurcation extending to internal carotid artery 70% stenosis LEFT internal carotid artery origin by NASCET criteria. LEFT vertebral artery is dominant. Mild stenosis of LEFT vertebral artery origin due to calcific atherosclerosis. Origin of the RIGHT vertebral artery is widely patent. No dissection, no pseudoaneurysm. No abnormal luminal irregularity. No contrast extravasation. Soft tissues are nonsuspicious. Nasogastric tube in place. Grade 1 C3-4 anterolisthesis on degenerative basis associated with severe LEFT facet arthropathy. No acute osseous process though bone windows have not been submitted. Old median sternotomy. CTA HEAD Anterior circulation: Patent cervical internal carotid arteries, petrous, cavernous and supra clinoid internal carotid arteries. Calcific atherosclerosis results in moderate stenosis RIGHT supraclinoid internal carotid artery. Widely patent anterior communicating artery. Normal appearance of the anterior and middle cerebral arteries. Posterior circulation: LEFT vertebral artery is dominant with normal appearance of the vertebral arteries, vertebrobasilar junction and basilar artery, as well as main branch vessels. Moderate calcific atherosclerosis of LEFT V4 segment. Normal appearance of the posterior cerebral arteries. No large vessel occlusion, hemodynamically significant stenosis, dissection, luminal  irregularity, contrast extravasation or aneurysm within the anterior nor posterior circulation. IMPRESSION: CT HEAD: Status post recent partial evacuation of LEFT holohemispheric subdural hematoma, similar in size resulting in stable 3 mm LEFT-to-RIGHT midline shift. No rebleed. No acute large vascular territory infarct. CT PERFUSION: Motion degraded examination, no convincing evidence of perfusion abnormality. Please note, artifact from LEFT subdural hematoma. 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 Less than 50% stenosis RIGHT ICA origin. Mild stenosis LEFT vertebral artery origin. Mild stenosis bilateral subclavian artery due to calcific atherosclerosis. CTA HEAD: No acute large vessel occlusion or high-grade stenosis. Moderate stenosis RIGHT supraclinoid internal carotid artery. Electronically Signed   By: Elon Alas M.D.   On: 09/22/2015 23:39   Dg Abd Portable 1v  09/21/2015  CLINICAL DATA:  Feeding tube placement. EXAM: PORTABLE ABDOMEN - 1 VIEW COMPARISON:  None. FINDINGS: The tip of a small bore feeding tube is in the distal duodenum, just proximal to the ligament of [place]itz. The bowel gas pattern is normal. IMPRESSION: The tip of a small bore feeding tube is in the distal duodenum, just proximal to the ligament of [place]itz. Electronically Signed   By: San Morelle M.D.   On: 09/21/2015 18:01    Assessment/Plan: Slow improvement   LOS: 10 days    Brittany Amirault S 09/23/2015, 1:02 PM

## 2015-09-23 NOTE — Progress Notes (Addendum)
STROKE TEAM PROGRESS NOTE   SUBJECTIVE (INTERVAL HISTORY) No family is at the bedside.  Overall he feels his condition is gradually improving. Undergoing TTE in room. Pt awake alert.    OBJECTIVE Temp:  [97.2 F (36.2 C)-99.2 F (37.3 C)] 98 F (36.7 C) (12/05 1609) Pulse Rate:  [84-106] 94 (12/05 1600) Cardiac Rhythm:  [-] Sinus tachycardia (12/05 1600) Resp:  [15-24] 16 (12/05 1600) BP: (133-168)/(54-68) 157/57 mmHg (12/05 1600) SpO2:  [96 %-99 %] 99 % (12/05 1600) Weight:  [172 lb 6.4 oz (78.2 kg)] 172 lb 6.4 oz (78.2 kg) (12/05 0500)   Recent Labs Lab 09/22/15 1934 09/22/15 2350 09/23/15 0351 09/23/15 0744 09/23/15 1126  GLUCAP 203* 164* 197* 199* 229*    Recent Labs Lab 09/19/15 0419 09/20/15 0308 09/21/15 0342  NA 135 135 143  K 4.7 6.0* 3.6  CL 108 109 113*  CO2 19* 18* 22  GLUCOSE 236* 145* 121*  BUN 27* 33* 24*  CREATININE 1.33* 1.08 1.02  CALCIUM 8.3* 8.2* 8.4*  MG 1.8 2.1 1.8  PHOS  --  2.3* 3.6    Recent Labs Lab 09/18/15 1831  AST 33  ALT 35    Recent Labs Lab 09/17/15 0356 09/18/15 0342 09/19/15 0419 09/20/15 0553 09/21/15 0342  WBC 2.3* 2.5* 2.4* 3.6* 3.2*  NEUTROABS 1.5* 1.6* 1.9  --   --   HGB 9.4* 9.7* 9.0* 10.5* 9.8*  HCT 27.7* 28.0* 25.9* 29.6* 29.2*  MCV 96.2 95.9 96.3 96.4 98.6  PLT 42* 82* 49* 59* 56*   No results for input(s): CKTOTAL, CKMB, CKMBINDEX, TROPONINI in the last 168 hours. No results for input(s): LABPROT, INR in the last 72 hours. No results for input(s): COLORURINE, LABSPEC, [place], GLUCOSEU, HGBUR, BILIRUBINUR, KETONESUR, PROTEINUR, UROBILINOGEN, NITRITE, LEUKOCYTESUR in the last 72 hours.  Invalid input(s): APPERANCEUR     Component Value Date/Time   CHOL 129 09/23/2015 0338   TRIG 87 09/23/2015 0338   HDL 27* 09/23/2015 0338   CHOLHDL 4.8 09/23/2015 0338   VLDL 17 09/23/2015 0338   LDLCALC 85 09/23/2015 0338   No results found for: HGBA1C No results found for: LABOPIA, COCAINSCRNUR, LABBENZ,  AMPHETMU, THCU, LABBARB  No results for input(s): ETH in the last 168 hours.  I have personally reviewed the radiological images below and agree with the radiology interpretations.  Ct Angio Head and neck W/cm &/or Wo Cm  09/22/2015   IMPRESSION: CT HEAD: Status post recent partial evacuation of LEFT holohemispheric subdural hematoma, similar in size resulting in stable 3 mm LEFT-to-RIGHT midline shift. No rebleed. No acute large vascular territory infarct. CT PERFUSION: Motion degraded examination, no convincing evidence of perfusion abnormality. Please note, artifact from LEFT subdural hematoma. 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 Less than 50% stenosis RIGHT ICA origin. Mild stenosis LEFT vertebral artery origin. Mild stenosis bilateral subclavian artery due to calcific atherosclerosis. CTA HEAD: No acute large vessel occlusion or high-grade stenosis. Moderate stenosis RIGHT supraclinoid internal carotid artery.   Ct Head Wo Contrast  09/19/2015  IMPRESSION: Decreased size of left subdural hematoma with improved 4-5 mm of left-to-right midline shift.   09/18/2015  IMPRESSION: Size stable left cerebral convexity subdural hematoma with 7 mm midline shift.    09/16/2015  IMPRESSION: 1. Slight increase in the extent of extra-axial high-density fluid/hemorrhage. 2. Slight increased mass effect with midline shift now measuring 5.5 mm.  09/14/2015  IMPRESSION: 1. Postoperative changes from interval left craniotomy for evacuation of large  left subdural hematoma. Left subdural drain remains in place. No complication. 2. Decreased size of left subdural hematoma with improved left-to-right shift, now measuring 4 mm.   09/13/2015  IMPRESSION: Suspected acute on chronic moderate to large sized left-sided subdural hematoma with associated mass effect and approximately 6 mm of left-to-right midline shift.   Mr Jeri Cos Wo Contrast  09/21/2015  IMPRESSION:  Residual/recurrent subdural hygroma/hematoma over the LEFT convexity, greatest in the LEFT frontal region, up to 16 mm in thickness, with persistent mass effect on the cortex and mild left-to-right mass effect. Multiple areas of punctate and confluent restricted diffusion in the LEFT hemisphere consistent with acute infarction. No parenchymal hemorrhage is evident.  2D Echocardiogram  pending   PHYSICAL EXAM  Temp:  [97.2 F (36.2 C)-99.2 F (37.3 C)] 98 F (36.7 C) (12/05 1609) Pulse Rate:  [84-106] 94 (12/05 1600) Resp:  [15-24] 16 (12/05 1600) BP: (133-168)/(54-68) 157/57 mmHg (12/05 1600) SpO2:  [96 %-99 %] 99 % (12/05 1600) Weight:  [172 lb 6.4 oz (78.2 kg)] 172 lb 6.4 oz (78.2 kg) (12/05 0500)  General - Well nourished, well developed, in no apparent distress.  Ophthalmologic - fundi not visualized due to noncooperation.  Cardiovascular - Regular rate and rhythm.  Mental Status -  Level of arousal and orientation to place, and person were intact, but not to time. Language including expression, naming, repetition, comprehension was assessed and found intact, however, severe dysarthria.  Cranial Nerves II - XII - II - Visual field intact OU. III, IV, VI - Extraocular movements intact. V - Facial sensation intact bilaterally. VII - Facial movement intact bilaterally. VIII - Hearing & vestibular intact bilaterally. X - Palate elevates symmetrically. XI - Chin turning & shoulder shrug intact bilaterally. XII - Tongue protrusion intact.  Motor Strength - The patient's strength was 4/5 LUE and LLE, 4-/5 RLE and 3/5 RUE.  Bulk was normal and fasciculations were absent.   Motor Tone - Muscle tone was assessed at the neck and appendages and was normal.  Reflexes - The patient's reflexes were symmetrical in all extremities and he had no pathological reflexes.  Sensory - Light touch, temperature/pinprick were assessed and were symmetrical.    Coordination - not cooperative on  testing with ataxia or dysmetria.  Tremor was absent.  Gait and Station - not tested due to weakness and in the process of TTE.   ASSESSMENT/PLAN Mr. Augusto Bucko is a 69 y.o. 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 s/p evacuation on 09/18/15, and seizure controlled with keppra and dilantin. Repeat CT showed left mass effect and midline shift with right UE and LE weakness. Stroke work up done showed punctate left MCA territory infarcts.   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 stenosis  MRI  2-3 left MCA territory punctate infarcts  CTA head and neck showed 70% proximal ICA stenosis  2D Echo  pending  LDL 85  HgbA1c pending  SCDs for VTE prophylaxis  Diet NPO time specified   No antithrombotic prior to admission, now on No antithrombotic  Patient counseled to be compliant with his antithrombotic medications  Ongoing aggressive stroke risk factor management  Therapy recommendations:  SNF  Disposition:  Pending  Left ICA stenosis  CTA showed left ICA 70% stenosis  Likely the cause of left MCA infarcts  However, pt not candidate for CEA or CAS due to chronic SDH and thrombocytopenia  BP goal around 140 for cerebral perfusion.  Seizure   On dilantin, change to po 100mg  tid  On keppra, change to po 1000mg  bid  EEG x 2 encephalopathy pattern  Continue dilantin and keppra as outpt  Diabetes  HgbA1c pending goal < 7.0  Uncontrolled  CBG monitoring  SSI  DM education  Hypertension  Home meds:   atenolol Currently on atenolol  Stable, goal around 140  Patient counseled to be compliant with his blood pressure medications  Hyperlipidemia  Home  meds:  zocor 40mg    Currently on zocor 40mg   LDL 85, goal < 70  Continue statin at discharge  Other Stroke Risk Factors  Advanced age  Other active issue  Thrombocytopenia   Hospital day # 10  Neurology will sign off. Please call with questions. Thanks for the consult.  Rosalin Hawking, MD PhD Stroke Neurology 09/23/2015 4:33 PM    To contact Stroke Continuity provider, please refer to [email or web address] After hours, contact General Neurology

## 2015-09-23 NOTE — Clinical Social Work Note (Signed)
Referrals made to [facility] and [facility] per son's request. CSW to followup with facility's response.   Liz Beach MSW, [place], [place], [phone]

## 2015-09-23 NOTE — NC FL2 (Signed)
[place] LEVEL OF CARE SCREENING TOOL     IDENTIFICATION  Patient Name: Benjamin Schroeder Birthdate: [birth year] Sex: male Admission Date (Current Location): 09/13/2015  [facility] and [place] Number:     Facility and Address:  [facility] [place], [place] [address] [place], [place] [address]      Provider Number: [id number]  Attending Physician Name and Address:  Eustace Moore, MD  Relative Name and Phone Number:       Current Level of Care: Hospital Recommended Level of Care: [facility] Prior Approval Number:    Date Approved/Denied:   PASRR Number:    Discharge Plan: SNF    Current Diagnoses: Patient Active Problem List   Diagnosis Date Noted  . Respiratory failure ([place])   . Seizures ([place])   . Cirrhosis of liver without ascites ([place])   . Encounter for intubation   . Leukopenia   . Essential hypertension   . Type 2 diabetes mellitus with complication ([place])   . Hepatic cirrhosis ([place])   . Thrombocytopenia ([place])   . Traumatic brain injury ([place])   . 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 SDH (subdural hematoma) ([place]) 09/13/2015  . Subdural hematoma ([place]) 09/13/2015    Orientation ACTIVITIES/SOCIAL BLADDER RESPIRATION    Self    Incontinent Normal  BEHAVIORAL SYMPTOMS/MOOD NEUROLOGICAL BOWEL NUTRITION STATUS      Incontinent Diet (Currently NPO, will update)  PHYSICIAN VISITS COMMUNICATION OF NEEDS Height & Weight Skin    Verbally 6' (182.9 cm) 184 lbs. Surgical wounds          AMBULATORY STATUS RESPIRATION    Assist extensive Normal      Personal Care Assistance Level of Assistance  Bathing, Dressing, Feeding Bathing Assistance: Maximum assistance Feeding assistance: Maximum assistance Dressing Assistance: Maximum assistance      Functional Limitations Info                SPECIAL CARE FACTORS FREQUENCY  Speech therapy     PT Frequency: 5/wk       Speech  Therapy Frequency: 5x/week     Additional Factors Info  Code Status, Allergies, Insulin Sliding Scale Code Status Info: FULL Allergies Info: NKA   Insulin Sliding Scale Info: 4/day       Current Medications (09/23/2015):  This is the current hospital active medication list Current Facility-Administered Medications  Medication Dose Route Frequency Provider Last Rate Last Dose  . acetaminophen (TYLENOL) tablet 650 mg  650 mg Oral Q4H PRN Eustace Moore, MD   650 mg at 09/21/15 2247   Or  . acetaminophen (TYLENOL) suppository 650 mg  650 mg Rectal Q4H PRN Eustace Moore, MD      . amantadine [facility]) solution 100 mg  100 mg Per NG tube BID Ram Fuller Mandril, MD   100 mg at 09/23/15 1448  . antiseptic oral rinse solution (CORINZ)  7 mL Mouth Rinse 10 times per day Eustace Moore, MD   7 mL at 09/23/15 1448  . atenolol (TENORMIN) tablet 25 mg  25 mg Oral Daily Eustace Moore, MD   25 mg at 09/23/15 [id number]  . chlorhexidine gluconate (PERIDEX) 0.12 % solution 15 mL  15 mL Mouth Rinse BID Eustace Moore, MD   15 mL at 09/23/15 0841  . feeding supplement (JEVITY 1.2 CAL) liquid 1,000 mL  1,000 mL Per Tube Continuous [name], RD      . [  START ON 09/24/2015] feeding supplement (PRO-STAT SUGAR FREE 64) liquid 30 mL  30 mL Per Tube Daily [name], RD      . fentaNYL (SUBLIMAZE) injection 50 mcg  50 mcg Intravenous Q15 min PRN [name], NP      . fentaNYL (SUBLIMAZE) injection 50 mcg  50 mcg Intravenous Q2H PRN [name], NP      . ferrous sulfate tablet 325 mg  325 mg Oral Q breakfast Eustace Moore, MD   325 mg at 09/23/15 0841  . insulin aspart (novoLOG) injection 0-15 Units  0-15 Units Subcutaneous 6 times per day [name], NP   5 Units at 09/23/15 1227  . labetalol (NORMODYNE,TRANDATE) injection 10-40 mg  10-40 mg Intravenous Q10 min PRN Eustace Moore, MD   20 mg at 09/21/15 2048  . levETIRAcetam (KEPPRA) 1,000 mg in sodium chloride 0.9 % 100 mL  IVPB  1,000 mg Intravenous Q12H Ram Fuller Mandril, MD   1,000 mg at 09/23/15 [id number]  . midazolam (VERSED) injection 1 mg  1 mg Intravenous Q15 min PRN [name], NP      . midazolam (VERSED) injection 1 mg  1 mg Intravenous Q2H PRN [name], NP      . ondansetron (ZOFRAN) tablet 4 mg  4 mg Oral Q4H PRN Eustace Moore, MD       Or  . ondansetron [facility]) injection 4 mg  4 mg Intravenous Q4H PRN Eustace Moore, MD      . pantoprazole (PROTONIX) injection 40 mg  40 mg Intravenous QHS Eustace Moore, MD   40 mg at 09/22/15 2057  . phenytoin (DILANTIN) injection 100 mg  100 mg Intravenous 3 times per day Ram Fuller Mandril, MD   100 mg at 09/23/15 1447  . promethazine (PHENERGAN) tablet 12.5-25 mg  12.5-25 mg Oral Q4H PRN Eustace Moore, MD      . propofol (DIPRIVAN) 1000 MG/100ML infusion  5-80 mcg/kg/min Intravenous Titrated [name], MD 5 mL/hr at 09/20/15 0700 9.944 mcg/kg/min at 09/20/15 0700     Discharge Medications: Please see discharge summary for a list of discharge medications.  Relevant Imaging Results:  Relevant Lab Results:  Recent Labs    Additional Information SS#: [id number]  Rigoberto Noel, LCSW

## 2015-09-23 NOTE — Progress Notes (Signed)
Echocardiogram 2D Echocardiogram has been performed.  Joelene Millin 09/23/2015, 11:01 AM

## 2015-09-23 NOTE — Progress Notes (Signed)
Inpatient Diabetes Program Recommendations  AACE/ADA: New Consensus Statement on Inpatient Glycemic Control (2015)  Target Ranges:  Prepandial:   less than 140 mg/dL      Peak postprandial:   less than 180 mg/dL (1-2 hours)      Critically ill patients:  140 - 180 mg/dL  Results for DAVIONE, FEEMSTER (MRN [id number]) as of 09/23/2015 09:34  Ref. Range 09/21/2015 23:31 09/22/2015 03:50 09/22/2015 07:58 09/22/2015 11:52 09/22/2015 16:12 09/22/2015 19:34 09/22/2015 23:50 09/23/2015 03:51 09/23/2015 07:44  Glucose-Capillary Latest Ref Range: 65-99 mg/dL 185 (H) 146 (H) 233 (H) 240 (H) 219 (H) 203 (H) 164 (H) 197 (H) 199 (H)   Review of Glycemic Control  Diabetes history: DM2 Outpatient Diabetes medications: Glipizide 10 mg BID Current orders for Inpatient glycemic control: Novolog 0-15 units Q4H  Inpatient Diabetes Program Recommendations: Insulin -Tube Feeding Coverage: Since being started on tube feedings, glucose has been more elevated. If tube feedings will be continued as ordered (Vital at 45 ml/hr), please consider ordering Novolog 3 units Q4H for tube feeding coverage (in addition to Novolog correction scale). If tube feeding is held or discontinued, then Novolog tube feeding coverage would aslo be held and/or discontinued.  Thanks, Barnie Alderman, RN, MSN, CDE Diabetes Coordinator Inpatient Diabetes Program [phone] (Team Pager from [place] to [place]) [phone] ([place] office) [phone] [facility] office) [phone] [facility] office)

## 2015-09-24 LAB — BASIC METABOLIC PANEL
ANION GAP: 9 (ref 5–15)
BUN: 25 mg/dL — ABNORMAL HIGH (ref 6–20)
CO2: 25 mmol/L (ref 22–32)
Calcium: 9.2 mg/dL (ref 8.9–10.3)
Chloride: 115 mmol/L — ABNORMAL HIGH (ref 101–111)
Creatinine, Ser: 1 mg/dL (ref 0.61–1.24)
GLUCOSE: 333 mg/dL — AB (ref 65–99)
POTASSIUM: 3.6 mmol/L (ref 3.5–5.1)
Sodium: 149 mmol/L — ABNORMAL HIGH (ref 135–145)

## 2015-09-24 LAB — CBC WITH DIFFERENTIAL/PLATELET
Basophils Absolute: 0 10*3/uL (ref 0.0–0.1)
Basophils Relative: 0 %
EOS ABS: 0.1 10*3/uL (ref 0.0–0.7)
EOS PCT: 1 %
HCT: 31.7 % — ABNORMAL LOW (ref 39.0–52.0)
HEMOGLOBIN: 10.8 g/dL — AB (ref 13.0–17.0)
LYMPHS ABS: 0.6 10*3/uL — AB (ref 0.7–4.0)
LYMPHS PCT: 11 %
MCH: 33.9 pg (ref 26.0–34.0)
MCHC: 34.1 g/dL (ref 30.0–36.0)
MCV: 99.4 fL (ref 78.0–100.0)
MONOS PCT: 7 %
Monocytes Absolute: 0.4 10*3/uL (ref 0.1–1.0)
Neutro Abs: 4.6 10*3/uL (ref 1.7–7.7)
Neutrophils Relative %: 81 %
PLATELETS: 64 10*3/uL — AB (ref 150–400)
RBC: 3.19 MIL/uL — ABNORMAL LOW (ref 4.22–5.81)
RDW: 12.8 % (ref 11.5–15.5)
WBC: 5.7 10*3/uL (ref 4.0–10.5)

## 2015-09-24 LAB — GLUCOSE, CAPILLARY
GLUCOSE-CAPILLARY: 128 mg/dL — AB (ref 65–99)
GLUCOSE-CAPILLARY: 153 mg/dL — AB (ref 65–99)
GLUCOSE-CAPILLARY: 232 mg/dL — AB (ref 65–99)
Glucose-Capillary: 240 mg/dL — ABNORMAL HIGH (ref 65–99)
Glucose-Capillary: 254 mg/dL — ABNORMAL HIGH (ref 65–99)
Glucose-Capillary: 229 mg/dL — ABNORMAL HIGH (ref 65–99)

## 2015-09-24 LAB — HEMOGLOBIN A1C
Hgb A1c MFr Bld: 6.7 % — ABNORMAL HIGH (ref 4.8–5.6)
Mean Plasma Glucose: 146 mg/dL

## 2015-09-24 LAB — PHENYTOIN LEVEL, TOTAL: Phenytoin Lvl: 9.7 ug/mL — ABNORMAL LOW (ref 10.0–20.0)

## 2015-09-24 MED ORDER — WHITE PETROLATUM GEL
Status: AC
  Filled 2015-09-24: qty 1

## 2015-09-24 MED ORDER — LEVETIRACETAM 750 MG PO TABS
750.0000 mg | ORAL_TABLET | Freq: Two times a day (BID) | ORAL | Status: DC
  Administered 2015-09-24 – 2015-09-25 (×3): 750 mg via ORAL
  Filled 2015-09-24 (×3): qty 1

## 2015-09-24 NOTE — Progress Notes (Signed)
Thank you for re-consult on Mr. Benjamin Schroeder. Please note prior consult and CM note from 11/28 and 11/29 respectively.  He lives alone and will continue to require assistance after discharge. Family is unable to provide this and prefers SNF in [place].  SW is currently working on bed search. Will defer CIR consult.

## 2015-09-24 NOTE — Progress Notes (Signed)
Nutrition Follow-up   INTERVENTION:  Magic cup TID between meals, each supplement provides 290 kcal and 9 grams of protein (offered from 59M nourishment)  NUTRITION DIAGNOSIS:   Inadequate oral intake related to dysphagia as evidenced by  (altered texture diet. ). Ongoing.   GOAL:   Patient will meet greater than or equal to 90% of their needs Not yet met.  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 To OR 11/25 for L craniotomy. Did well post op until 11/30 when he developed global aphasia and R hemiparesis as well as ?seizure activity and went back to OR for redo crani. Post op was extubated initially but had deteriorating mental status and respiratory failure .  Pt extubated 12/2 Had failed swallow eval and continued to receive TF via Cortrak Pt passed swallow this am and Cortrak was removed.  Pt discussed during ICU rounds and with RN.  Per RN diarrhea is less.  Labs reviewed: sodium elevated 149 CBG's: 232-254 - DM coordinator recommends adding levemir.   Diet Order:  DIET - DYS 1 Room service appropriate?: Yes; Fluid consistency:: Nectar Thick  Skin:  Reviewed, no issues  Last BM:  275 ml 12/5 via flexiseal  Height:   Ht Readings from Last 1 Encounters:  09/18/15 6' (1.829 m)   Weight:   Wt Readings from Last 1 Encounters:  09/24/15 167 lb 12.3 oz (76.1 kg)   Ideal Body Weight:  80.9 kg  BMI:  Body mass index is 22.75 kg/(m^2).  Estimated Nutritional Needs:   Kcal:  1900-2100  Protein:  90-105 grams  Fluid:  > 1.9 L/day  EDUCATION NEEDS:   No education needs identified at this time  [place], [place], [place] Pager [phone] After Hours Pager

## 2015-09-24 NOTE — Progress Notes (Signed)
Speech Language Pathology Treatment: Dysphagia  Patient Details Name: Benjamin Schroeder MRN: [id number] DOB: [birth year] Today's Date: 09/24/2015 Time: [id number] SLP Time Calculation (min) (ACUTE ONLY): 19 min  Assessment / Plan / Recommendation Clinical Impression  Diagnostic treatment complete with focus on readiness for a po diet. Po trials provided by SLP. Patient continues to present with oral deficits as noted on initial evaluation characterized by prolonged mastication and left sided buccal pocketing, unreduced with max verbal and visual cueing, eliminated with pureed solid trials. Intermittent s/s of a potential esophageal and/or pharyngeal component noted characterized by multiple swallows, frequent belching, and occasional throat clear post swallow, worse with thin than thickened liquids and large bolus size. Favoring esophageal impairment and/or presence of NG tube given intact swallow prior to most recent craniotomy and overall improved medical condition. Max SLP cueing for use of tsp for controlled bolus effective at decreasing signs noted above. Recommend initiation of conservative diet with full supervision for use of precautions. Suspect that removal of NG tube alone will aid in improved function and possibility of upgrade.    HPI HPI: Pt is a 8 male who suffered a mechanical fall 3-4 weeks ago who came to ED with HA on 11/25. Pt found to have L SDH. Pt s/p L craniotomy for evactuation of subdural hematoma on 11/25. He was taken back to the OR to redo his initial crani.  Post op he was initially extubated but had to be re-intubated due to deteriorating mental status.        SLP Plan  Goals updated (cognitive-linguistic goals updated)     Recommendations  Diet recommendations: Dysphagia 1 (puree);Nectar-thick liquid Liquids provided via: Teaspoon Medication Administration: Whole meds with puree Supervision: Patient able to self feed;Full supervision/cueing for compensatory  strategies Compensations: Slow rate;Small sips/bites Postural Changes and/or Swallow Maneuvers: Seated upright 90 degrees              General recommendations: Rehab consult Oral Care Recommendations: Oral care BID Follow up Recommendations: Inpatient Rehab Plan: Goals updated (cognitive-linguistic goals updated)  Gabriel [place], CCC-SLP [phone]  Teyla Skidgel Meryl 09/24/2015, 8:38 AM

## 2015-09-24 NOTE — Progress Notes (Signed)
Inpatient Diabetes Program Recommendations  AACE/ADA: New Consensus Statement on Inpatient Glycemic Control (2015)  Target Ranges:  Prepandial:   less than 140 mg/dL      Peak postprandial:   less than 180 mg/dL (1-2 hours)      Critically ill patients:  140 - 180 mg/dL  Results for Benjamin Schroeder, Benjamin Schroeder (MRN [id number]) as of 09/24/2015 09:06  Ref. Range 09/23/2015 07:44 09/23/2015 11:26 09/23/2015 15:57 09/23/2015 20:18 09/24/2015 00:07 09/24/2015 03:11 09/24/2015 07:33  Glucose-Capillary Latest Ref Range: 65-99 mg/dL 199 (H) 229 (H) 199 (H) 199 (H) 232 (H) 240 (H) 254 (H)   Review of Glycemic Control Diabetes history: DM2 Outpatient Diabetes medications: Glipizide 10 mg BID Current orders for Inpatient glycemic control: Novolog 0-15 units Q4H  Inpatient Diabetes Program Recommendations: Insulin - Basal: Over the past 24 hours glucose has ranged from 199-254 mg/dl and patient has received a total of Novolog 34 units for correction. Please consider ordering Levemir 10 units daily starting now. HgbA1C: A1C 6.7% on 09/23/15 indicating good glycemic control over the past 2-3 months.  Thanks, Barnie Alderman, RN, MSN, CDE Diabetes Coordinator Inpatient Diabetes Program [phone] (Team Pager from [place] to [place]) [phone] ([place] office) [phone] [facility] office) [phone] [facility] office)

## 2015-09-24 NOTE — Progress Notes (Signed)
Patient ID: Benjamin Schroeder, male   DOB: 06/13/1946, 69 y.o.   MRN: [id number] Subjective: Patient reports no real headache.  Objective: Vital signs in last 24 hours: Temp:  [97.2 F (36.2 C)-98.5 F (36.9 C)] 98 F (36.7 C) (12/06 0735) Pulse Rate:  [84-101] 93 (12/06 0600) Resp:  [14-20] 14 (12/06 0600) BP: (131-168)/(54-91) 145/67 mmHg (12/06 0600) SpO2:  [94 %-99 %] 98 % (12/06 0600) Weight:  [76.1 kg (167 lb 12.3 oz)] 76.1 kg (167 lb 12.3 oz) (12/06 0500)  Intake/Output from previous day: 12/05 0701 - 12/06 0700 In: 1320 [NG/GT:1200; IV Piggyback:110] Out: [id number] [Urine:1145; Stool:275] Intake/Output this shift:    He is much more awake and alert today. He is working with the speech therapist. He's mildly right hemiparetic, incision remains clean dry and intact, still some disorientation but can name objects and follow commands  Lab Results: Lab Results  Component Value Date   WBC 5.7 09/24/2015   HGB 10.8* 09/24/2015   HCT 31.7* 09/24/2015   MCV 99.4 09/24/2015   PLT 64* 09/24/2015   Lab Results  Component Value Date   INR 1.20 09/18/2015   BMET Lab Results  Component Value Date   NA 143 09/21/2015   K 3.6 09/21/2015   CL 113* 09/21/2015   CO2 22 09/21/2015   GLUCOSE 121* 09/21/2015   BUN 24* 09/21/2015   CREATININE 1.02 09/21/2015   CALCIUM 8.4* 09/21/2015    Studies/Results: Ct Angio Head W/cm &/or Wo Cm  09/22/2015  CLINICAL DATA:  Mechanical fall 3-4 weeks ago, status post subdural hematoma evacuation September 13, 2015 and redo craniotomy September 18, 2015. Encephalopathy. Assess LEFT stroke. History of diabetes, cirrhosis, recurrent blood transfusion. EXAM: CT CEREBRAL PERFUSION WITH CONTRAST; CT ANGIOGRAPHY HEAD; CT ANGIOGRAPHY NECK TECHNIQUE: Multidetector CT imaging of the brain was performed during bolus injection of intravenous contrast. Multiplanar CT angiographic image reconstructions including MIPs were generated to evaluate cerebral vasculature  centered at the [place] of [place].; Multidetector CT imaging of the neck was performed during bolus injection of intravenous contrast. Multiplanar CT angiographic image reconstructions including MIPs were generated to evaluate the extra-cranial carotid arteries. CONTRAST:  169mL OMNIPAQUE IOHEXOL 350 MG/ML SOLN COMPARISON:  MRI of the brain September 21, 2015 FINDINGS: CT HEAD: Recent partial evacuation of LEFT holohemispheric acute to subacute subdural hematoma measuring up to 13 mm in transaxial dimension. Extra-axial pneumocephalus and probable Surgicel within the subdural space. 3 mm LEFT-to-RIGHT midline shift, relatively unchanged. Partially effaced LEFT lateral ventricle is similar, no ventricular entrapment or hydrocephalus. No intraparenchymal hemorrhage. No acute large vascular territory infarct. Basal cisterns are patent. Small LEFT middle cranial fossa arachnoid cyst without mass effect. No abnormal intracranial enhancement. Moderate to severe calcific atherosclerosis the carotid siphons. Ocular globes and orbital contents are unremarkable. LEFT frontal craniotomy, with overlying scalp hematoma and skin staples. Mild paranasal sinus mucosal thickening. Small RIGHT mastoid effusion. Life-support lines in place. CTA PERFUSION: Motion degraded examination. No convincing evidence of perfusion mismatch. Artifact related to LEFT subdural hematoma and extra-axial pneumocephalus. CTA NECK Normal appearance of the thoracic arch, normal branch pattern. Mild calcific atherosclerosis and stenosis LEFT Common carotid artery origin. Calcific atherosclerosis of the LEFT subclavian artery results in mild stenosis though, limited by patient motion. Calcific atherosclerosis resulting in mild stenosis proximal RIGHT subclavian artery. Eccentric calcific atherosclerosis of the RIGHT Common carotid artery resulting in mild stenosis. Eccentric calcific atherosclerosis of RIGHT carotid bifurcation extent internal carotid artery  with less than 50% stenosis by NASCET criteria. Remainder  RIGHT internal carotid artery widely patent. Eccentric intimal thickening and calcific atherosclerosis results in mild stenosis mid LEFT Common carotid artery. Eccentric calcific atherosclerosis of the LEFT carotid bifurcation extending to internal carotid artery 70% stenosis LEFT internal carotid artery origin by NASCET criteria. LEFT vertebral artery is dominant. Mild stenosis of LEFT vertebral artery origin due to calcific atherosclerosis. Origin of the RIGHT vertebral artery is widely patent. No dissection, no pseudoaneurysm. No abnormal luminal irregularity. No contrast extravasation. Soft tissues are nonsuspicious. Nasogastric tube in place. Grade 1 C3-4 anterolisthesis on degenerative basis associated with severe LEFT facet arthropathy. No acute osseous process though bone windows have not been submitted. Old median sternotomy. CTA HEAD Anterior circulation: Patent cervical internal carotid arteries, petrous, cavernous and supra clinoid internal carotid arteries. Calcific atherosclerosis results in moderate stenosis RIGHT supraclinoid internal carotid artery. Widely patent anterior communicating artery. Normal appearance of the anterior and middle cerebral arteries. Posterior circulation: LEFT vertebral artery is dominant with normal appearance of the vertebral arteries, vertebrobasilar junction and basilar artery, as well as main branch vessels. Moderate calcific atherosclerosis of LEFT V4 segment. Normal appearance of the posterior cerebral arteries. No large vessel occlusion, hemodynamically significant stenosis, dissection, luminal irregularity, contrast extravasation or aneurysm within the anterior nor posterior circulation. IMPRESSION: CT HEAD: Status post recent partial evacuation of LEFT holohemispheric subdural hematoma, similar in size resulting in stable 3 mm LEFT-to-RIGHT midline shift. No rebleed. No acute large vascular territory infarct.  CT PERFUSION: Motion degraded examination, no convincing evidence of perfusion abnormality. Please note, artifact from LEFT subdural hematoma. 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 Less than 50% stenosis RIGHT ICA origin. Mild stenosis LEFT vertebral artery origin. Mild stenosis bilateral subclavian artery due to calcific atherosclerosis. CTA HEAD: No acute large vessel occlusion or high-grade stenosis. Moderate stenosis RIGHT supraclinoid internal carotid artery. Electronically Signed   By: Elon Alas M.D.   On: 09/22/2015 23:39   Ct Angio Neck W/cm &/or Wo/cm  09/22/2015  CLINICAL DATA:  Mechanical fall 3-4 weeks ago, status post subdural hematoma evacuation September 13, 2015 and redo craniotomy September 18, 2015. Encephalopathy. Assess LEFT stroke. History of diabetes, cirrhosis, recurrent blood transfusion. EXAM: CT CEREBRAL PERFUSION WITH CONTRAST; CT ANGIOGRAPHY HEAD; CT ANGIOGRAPHY NECK TECHNIQUE: Multidetector CT imaging of the brain was performed during bolus injection of intravenous contrast. Multiplanar CT angiographic image reconstructions including MIPs were generated to evaluate cerebral vasculature centered at the Circle of [place].; Multidetector CT imaging of the neck was performed during bolus injection of intravenous contrast. Multiplanar CT angiographic image reconstructions including MIPs were generated to evaluate the extra-cranial carotid arteries. CONTRAST:  121mL OMNIPAQUE IOHEXOL 350 MG/ML SOLN COMPARISON:  MRI of the brain September 21, 2015 FINDINGS: CT HEAD: Recent partial evacuation of LEFT holohemispheric acute to subacute subdural hematoma measuring up to 13 mm in transaxial dimension. Extra-axial pneumocephalus and probable Surgicel within the subdural space. 3 mm LEFT-to-RIGHT midline shift, relatively unchanged. Partially effaced LEFT lateral ventricle is similar, no ventricular entrapment or hydrocephalus. No intraparenchymal  hemorrhage. No acute large vascular territory infarct. Basal cisterns are patent. Small LEFT middle cranial fossa arachnoid cyst without mass effect. No abnormal intracranial enhancement. Moderate to severe calcific atherosclerosis the carotid siphons. Ocular globes and orbital contents are unremarkable. LEFT frontal craniotomy, with overlying scalp hematoma and skin staples. Mild paranasal sinus mucosal thickening. Small RIGHT mastoid effusion. Life-support lines in place. CTA PERFUSION: Motion degraded examination. No convincing evidence of perfusion mismatch. Artifact related to LEFT subdural  hematoma and extra-axial pneumocephalus. CTA NECK Normal appearance of the thoracic arch, normal branch pattern. Mild calcific atherosclerosis and stenosis LEFT Common carotid artery origin. Calcific atherosclerosis of the LEFT subclavian artery results in mild stenosis though, limited by patient motion. Calcific atherosclerosis resulting in mild stenosis proximal RIGHT subclavian artery. Eccentric calcific atherosclerosis of the RIGHT Common carotid artery resulting in mild stenosis. Eccentric calcific atherosclerosis of RIGHT carotid bifurcation extent internal carotid artery with less than 50% stenosis by NASCET criteria. Remainder RIGHT internal carotid artery widely patent. Eccentric intimal thickening and calcific atherosclerosis results in mild stenosis mid LEFT Common carotid artery. Eccentric calcific atherosclerosis of the LEFT carotid bifurcation extending to internal carotid artery 70% stenosis LEFT internal carotid artery origin by NASCET criteria. LEFT vertebral artery is dominant. Mild stenosis of LEFT vertebral artery origin due to calcific atherosclerosis. Origin of the RIGHT vertebral artery is widely patent. No dissection, no pseudoaneurysm. No abnormal luminal irregularity. No contrast extravasation. Soft tissues are nonsuspicious. Nasogastric tube in place. Grade 1 C3-4 anterolisthesis on degenerative  basis associated with severe LEFT facet arthropathy. No acute osseous process though bone windows have not been submitted. Old median sternotomy. CTA HEAD Anterior circulation: Patent cervical internal carotid arteries, petrous, cavernous and supra clinoid internal carotid arteries. Calcific atherosclerosis results in moderate stenosis RIGHT supraclinoid internal carotid artery. Widely patent anterior communicating artery. Normal appearance of the anterior and middle cerebral arteries. Posterior circulation: LEFT vertebral artery is dominant with normal appearance of the vertebral arteries, vertebrobasilar junction and basilar artery, as well as main branch vessels. Moderate calcific atherosclerosis of LEFT V4 segment. Normal appearance of the posterior cerebral arteries. No large vessel occlusion, hemodynamically significant stenosis, dissection, luminal irregularity, contrast extravasation or aneurysm within the anterior nor posterior circulation. IMPRESSION: CT HEAD: Status post recent partial evacuation of LEFT holohemispheric subdural hematoma, similar in size resulting in stable 3 mm LEFT-to-RIGHT midline shift. No rebleed. No acute large vascular territory infarct. CT PERFUSION: Motion degraded examination, no convincing evidence of perfusion abnormality. Please note, artifact from LEFT subdural hematoma. 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 Less than 50% stenosis RIGHT ICA origin. Mild stenosis LEFT vertebral artery origin. Mild stenosis bilateral subclavian artery due to calcific atherosclerosis. CTA HEAD: No acute large vessel occlusion or high-grade stenosis. Moderate stenosis RIGHT supraclinoid internal carotid artery. Electronically Signed   By: Elon Alas M.D.   On: 09/22/2015 23:39   Ct Cerebral Perfusion W/cm  09/22/2015  CLINICAL DATA:  Mechanical fall 3-4 weeks ago, status post subdural hematoma evacuation September 13, 2015 and redo craniotomy  September 18, 2015. Encephalopathy. Assess LEFT stroke. History of diabetes, cirrhosis, recurrent blood transfusion. EXAM: CT CEREBRAL PERFUSION WITH CONTRAST; CT ANGIOGRAPHY HEAD; CT ANGIOGRAPHY NECK TECHNIQUE: Multidetector CT imaging of the brain was performed during bolus injection of intravenous contrast. Multiplanar CT angiographic image reconstructions including MIPs were generated to evaluate cerebral vasculature centered at the [place] of [place].; Multidetector CT imaging of the neck was performed during bolus injection of intravenous contrast. Multiplanar CT angiographic image reconstructions including MIPs were generated to evaluate the extra-cranial carotid arteries. CONTRAST:  168mL OMNIPAQUE IOHEXOL 350 MG/ML SOLN COMPARISON:  MRI of the brain September 21, 2015 FINDINGS: CT HEAD: Recent partial evacuation of LEFT holohemispheric acute to subacute subdural hematoma measuring up to 13 mm in transaxial dimension. Extra-axial pneumocephalus and probable Surgicel within the subdural space. 3 mm LEFT-to-RIGHT midline shift, relatively unchanged. Partially effaced LEFT lateral ventricle is similar, no ventricular entrapment or  hydrocephalus. No intraparenchymal hemorrhage. No acute large vascular territory infarct. Basal cisterns are patent. Small LEFT middle cranial fossa arachnoid cyst without mass effect. No abnormal intracranial enhancement. Moderate to severe calcific atherosclerosis the carotid siphons. Ocular globes and orbital contents are unremarkable. LEFT frontal craniotomy, with overlying scalp hematoma and skin staples. Mild paranasal sinus mucosal thickening. Small RIGHT mastoid effusion. Life-support lines in place. CTA PERFUSION: Motion degraded examination. No convincing evidence of perfusion mismatch. Artifact related to LEFT subdural hematoma and extra-axial pneumocephalus. CTA NECK Normal appearance of the thoracic arch, normal branch pattern. Mild calcific atherosclerosis and stenosis LEFT  Common carotid artery origin. Calcific atherosclerosis of the LEFT subclavian artery results in mild stenosis though, limited by patient motion. Calcific atherosclerosis resulting in mild stenosis proximal RIGHT subclavian artery. Eccentric calcific atherosclerosis of the RIGHT Common carotid artery resulting in mild stenosis. Eccentric calcific atherosclerosis of RIGHT carotid bifurcation extent internal carotid artery with less than 50% stenosis by NASCET criteria. Remainder RIGHT internal carotid artery widely patent. Eccentric intimal thickening and calcific atherosclerosis results in mild stenosis mid LEFT Common carotid artery. Eccentric calcific atherosclerosis of the LEFT carotid bifurcation extending to internal carotid artery 70% stenosis LEFT internal carotid artery origin by NASCET criteria. LEFT vertebral artery is dominant. Mild stenosis of LEFT vertebral artery origin due to calcific atherosclerosis. Origin of the RIGHT vertebral artery is widely patent. No dissection, no pseudoaneurysm. No abnormal luminal irregularity. No contrast extravasation. Soft tissues are nonsuspicious. Nasogastric tube in place. Grade 1 C3-4 anterolisthesis on degenerative basis associated with severe LEFT facet arthropathy. No acute osseous process though bone windows have not been submitted. Old median sternotomy. CTA HEAD Anterior circulation: Patent cervical internal carotid arteries, petrous, cavernous and supra clinoid internal carotid arteries. Calcific atherosclerosis results in moderate stenosis RIGHT supraclinoid internal carotid artery. Widely patent anterior communicating artery. Normal appearance of the anterior and middle cerebral arteries. Posterior circulation: LEFT vertebral artery is dominant with normal appearance of the vertebral arteries, vertebrobasilar junction and basilar artery, as well as main branch vessels. Moderate calcific atherosclerosis of LEFT V4 segment. Normal appearance of the posterior  cerebral arteries. No large vessel occlusion, hemodynamically significant stenosis, dissection, luminal irregularity, contrast extravasation or aneurysm within the anterior nor posterior circulation. IMPRESSION: CT HEAD: Status post recent partial evacuation of LEFT holohemispheric subdural hematoma, similar in size resulting in stable 3 mm LEFT-to-RIGHT midline shift. No rebleed. No acute large vascular territory infarct. CT PERFUSION: Motion degraded examination, no convincing evidence of perfusion abnormality. Please note, artifact from LEFT subdural hematoma. 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 Less than 50% stenosis RIGHT ICA origin. Mild stenosis LEFT vertebral artery origin. Mild stenosis bilateral subclavian artery due to calcific atherosclerosis. CTA HEAD: No acute large vessel occlusion or high-grade stenosis. Moderate stenosis RIGHT supraclinoid internal carotid artery. Electronically Signed   By: Elon Alas M.D.   On: 09/22/2015 23:39    Assessment/Plan: He is improving very quickly. He is much better today than he was even yesterday. If he continues along this course of think he will be a good rehabilitation candidate rather than skilled nursing facility. I think he would receive better care there and get better faster. I will start to slowly wean his anticonvulsants as the cortical irritation is probably much improved as evidence by his neurologic improvement. Continue to try to mobilize as tolerated.   LOS: 11 days    Aleane Wesenberg S 09/24/2015, 8:35 AM

## 2015-09-24 NOTE — Progress Notes (Signed)
Physical Therapy Treatment Patient Details Name: Benjamin Schroeder MRN: [id number] DOB: 10/13/1946 Today's Date: 09/24/2015    History of Present Illness Pt is a 41 male who suffered a mechanical fall 3-4 weeks ago who came to ED with HA on 11/25. Pt found to have L SDH. Pt s/p L craniotomy for evactuation of subdural hematoma on 11/25. Underwent craniectomy on 11/30.      PT Comments    Pt with much improved communication and following directions as compared to when this PT saw him previously.  Pt with strong posterior lean during transfer and requires 2 person A for all mobility.  Will continue to follow.    Follow Up Recommendations  SNF     Equipment Recommendations  None recommended by PT    Recommendations for Other Services       Precautions / Restrictions Precautions Precautions: Fall Restrictions Weight Bearing Restrictions: No    Mobility  Bed Mobility Overal bed mobility: Needs Assistance;+2 for physical assistance Bed Mobility: Supine to Sit     Supine to sit: Mod assist;HOB elevated     General bed mobility comments: A with trunk and scooting hips to EOB.  Increased time needed for following cues.    Transfers Overall transfer level: Needs assistance Equipment used: 2 person hand held assist Transfers: Sit to/from [facility] Sit to Stand: Mod assist;+2 physical assistance Stand pivot transfers: Max assist;+2 physical assistance       General transfer comment: pt able to come to stand, but needed increased A to complete pivot to recliner.  pt with strong posterior lean and pushes Bil LEs out in front of him despite attempts at blocking LEs.    Ambulation/Gait                 Stairs            Wheelchair Mobility    Modified Rankin (Stroke Patients Only) Modified Rankin (Stroke Patients Only) Pre-Morbid Rankin Score: No symptoms Modified Rankin: Severe disability     Balance Overall balance assessment: Needs  assistance Sitting-balance support: Feet supported;Bilateral upper extremity supported Sitting balance-Leahy Scale: Poor Sitting balance - Comments: posterior lean.  Needs cueing to attend to balance and correct.   Postural control: Posterior lean Standing balance support: During functional activity Standing balance-Leahy Scale: Poor                      Cognition Arousal/Alertness: Awake/alert Behavior During Therapy: Flat affect Overall Cognitive Status: Impaired/Different from baseline Area of Impairment: Orientation;Attention;Memory;Following commands;Safety/judgement;Awareness;Problem solving Orientation Level: Disoriented to;Situation;Time Current Attention Level: Focused Memory: Decreased short-term memory;Decreased recall of precautions Following Commands: Follows one step commands with increased time;Follows one step commands inconsistently Safety/Judgement: Decreased awareness of safety;Decreased awareness of deficits Awareness: Intellectual Problem Solving: Slow processing;Decreased initiation;Difficulty sequencing;Requires verbal cues;Requires tactile cues General Comments: pt able to follow most one step directions with verbal cueing only.  pt unable to state his location, but when given choice of right and wrong answers, pt was able to pick out right answer without cueing.      Exercises      General Comments        Pertinent Vitals/Pain Pain Assessment: No/denies pain    Home Living                      Prior Function            PT Goals (current goals can now be found in  the care plan section) Acute Rehab PT Goals Patient Stated Goal: not stated PT Goal Formulation: Patient unable to participate in goal setting Time For Goal Achievement: 09/29/15 Potential to Achieve Goals: Good Progress towards PT goals: Progressing toward goals    Frequency  Min 3X/week    PT Plan Current plan remains appropriate    Co-evaluation              End of Session Equipment Utilized During Treatment: Gait belt Activity Tolerance: Patient tolerated treatment well Patient left: in chair;with call bell/phone within reach;with chair alarm set     Time: [id number] PT Time Calculation (min) (ACUTE ONLY): 25 min  Charges:  $Therapeutic Activity: 23-37 mins                    G CodesCatarina Hartshorn, Bartholomew 09/24/2015, 10:34 AM

## 2015-09-25 LAB — CBC WITH DIFFERENTIAL/PLATELET
Basophils Absolute: 0 10*3/uL (ref 0.0–0.1)
Basophils Relative: 0 %
EOS ABS: 0.1 10*3/uL (ref 0.0–0.7)
Eosinophils Relative: 1 %
HCT: 33.6 % — ABNORMAL LOW (ref 39.0–52.0)
HEMOGLOBIN: 11.1 g/dL — AB (ref 13.0–17.0)
LYMPHS ABS: 0.7 10*3/uL (ref 0.7–4.0)
Lymphocytes Relative: 15 %
MCH: 32.8 pg (ref 26.0–34.0)
MCHC: 33 g/dL (ref 30.0–36.0)
MCV: 99.4 fL (ref 78.0–100.0)
MONO ABS: 0.3 10*3/uL (ref 0.1–1.0)
MONOS PCT: 6 %
NEUTROS PCT: 77 %
Neutro Abs: 3.9 10*3/uL (ref 1.7–7.7)
Platelets: 71 10*3/uL — ABNORMAL LOW (ref 150–400)
RBC: 3.38 MIL/uL — ABNORMAL LOW (ref 4.22–5.81)
RDW: 13 % (ref 11.5–15.5)
WBC: 5 10*3/uL (ref 4.0–10.5)

## 2015-09-25 LAB — GLUCOSE, CAPILLARY
GLUCOSE-CAPILLARY: 132 mg/dL — AB (ref 65–99)
GLUCOSE-CAPILLARY: 270 mg/dL — AB (ref 65–99)
GLUCOSE-CAPILLARY: 284 mg/dL — AB (ref 65–99)
Glucose-Capillary: 155 mg/dL — ABNORMAL HIGH (ref 65–99)
Glucose-Capillary: 167 mg/dL — ABNORMAL HIGH (ref 65–99)
Glucose-Capillary: 229 mg/dL — ABNORMAL HIGH (ref 65–99)
Glucose-Capillary: 262 mg/dL — ABNORMAL HIGH (ref 65–99)

## 2015-09-25 MED ORDER — LEVETIRACETAM 500 MG PO TABS
500.0000 mg | ORAL_TABLET | Freq: Two times a day (BID) | ORAL | Status: DC
Start: 2015-09-25 — End: 2015-09-28
  Administered 2015-09-25 – 2015-09-28 (×6): 500 mg via ORAL
  Filled 2015-09-25 (×6): qty 1

## 2015-09-25 MED ORDER — PANTOPRAZOLE SODIUM 40 MG PO TBEC
40.0000 mg | DELAYED_RELEASE_TABLET | Freq: Every day | ORAL | Status: DC
  Administered 2015-09-25 – 2015-09-27 (×3): 40 mg via ORAL
  Filled 2015-09-25 (×3): qty 1

## 2015-09-25 NOTE — Progress Notes (Signed)
Speech Language Pathology Treatment: Dysphagia  Patient Details Name: Benjamin Schroeder MRN: [id number] DOB: 1946/02/08 Today's Date: 09/25/2015 Time: 0815-0830 SLP Time Calculation (min) (ACUTE ONLY): 15 min  Assessment / Plan / Recommendation Clinical Impression  Treatment focused on dysphagia goals with diet tolerance and readiness to advance diet. Patient alert and cooperative. With maximum verbal, visual, and tactile cueing for small single sips, patient able to consume thin liquids via cup sips without overt indication of aspiration. Intermittent belching continues indicative of GI component although improved now that NG tube has been removed. Recommend diet advancement to puree with thin liquids, focusing on tolerance of thin liquids prior to potential advancement of solids given oral phase deficits/mixed consistencies likely to increase aspiration risk. Will f/u closely.    HPI HPI: Pt is a 13 male who suffered a mechanical fall 3-4 weeks ago who came to ED with HA on 11/25. Pt found to have L SDH. Pt s/p L craniotomy for evactuation of subdural hematoma on 11/25. He was taken back to the OR to redo his initial crani.  Post op he was initially extubated but had to be re-intubated due to deteriorating mental status.        SLP Plan  Goals updated     Recommendations  Diet recommendations: Dysphagia 1 (puree);Thin liquid Liquids provided via: Cup;No straw Medication Administration: Whole meds with puree Supervision: Patient able to self feed;Full supervision/cueing for compensatory strategies Compensations: Slow rate;Small sips/bites (small single sips) Postural Changes and/or Swallow Maneuvers: Seated upright 90 degrees              Oral Care Recommendations: Oral care BID Follow up Recommendations: Inpatient Rehab Plan: Goals updated  Gabriel Rainwater [place], CCC-SLP [phone]  Benjamin Schroeder 09/25/2015, 8:52 AM

## 2015-09-25 NOTE — Care Management Important Message (Signed)
Important Message  Patient Details  Name: Benjamin Schroeder MRN: [id number] Date of Birth: 1945-10-24   Medicare Important Message Given:  Yes    Nathen May 09/25/2015, 11:41 AM

## 2015-09-25 NOTE — Progress Notes (Signed)
Patient ID: Benjamin Schroeder, male   DOB: January 03, 1946, 69 y.o.   MRN: [id number] Subjective: Patient reports no headache  Objective: Vital signs in last 24 hours: Temp:  [97.8 F (36.6 C)-98.7 F (37.1 C)] 98.2 F (36.8 C) (12/07 0800) Pulse Rate:  [77-104] 91 (12/07 0928) Resp:  [14-24] 15 (12/07 0800) BP: (124-162)/(52-93) 135/57 mmHg (12/07 0928) SpO2:  [94 %-100 %] 94 % (12/07 0800) Weight:  [76.3 kg (168 lb 3.4 oz)] 76.3 kg (168 lb 3.4 oz) (12/07 0500)  Intake/Output from previous day: 12/06 0701 - 12/07 0700 In: 100 [NG/GT:100] Out: 1350 [Urine:1300; Stool:50] Intake/Output this shift: Total I/O In: 250 [P.O.:250] Out: 150 [Urine:150]  He continues to improve. He is wide awake and watching a movie, he moves all extremities that he is a little weak on the right. No facial droop, incision looks good, states his name and place an age  Lab Results: Lab Results  Component Value Date   WBC 5.0 09/25/2015   HGB 11.1* 09/25/2015   HCT 33.6* 09/25/2015   MCV 99.4 09/25/2015   PLT 71* 09/25/2015   Lab Results  Component Value Date   INR 1.20 09/18/2015   BMET Lab Results  Component Value Date   NA 149* 09/24/2015   K 3.6 09/24/2015   CL 115* 09/24/2015   CO2 25 09/24/2015   GLUCOSE 333* 09/24/2015   BUN 25* 09/24/2015   CREATININE 1.00 09/24/2015   CALCIUM 9.2 09/24/2015    Studies/Results: No results found.  Assessment/Plan: Overall continues to improve. I think he is moving quickly toward a rehabilitation candidate. Transfer to floor today.   LOS: 12 days    Shanta Dorvil S 09/25/2015, 10:12 AM

## 2015-09-25 NOTE — Progress Notes (Signed)
Pt here from unit, he has foul odor due to rectal tube. He is pleasantly confused

## 2015-09-25 NOTE — Clinical Social Work Note (Signed)
Clinical Social Worker continuing to follow patient and family for support and discharge planning needs.  CSW spoke with patient son over the phone to update on facility status - referral sent, however no decision as of yet from either facility.  Patient son aware that if facility in [place] does not extend bed offer the alternative placement would be a local facility.  CSW remains available for support and to facilitate patient discharge needs once medically stable.  Barbette Or, [place]

## 2015-09-25 NOTE — Evaluation (Signed)
Occupational Therapy Evaluation Patient Details Name: Benjamin Schroeder MRN: [id number] DOB: 09/08/1946 Today's Date: 09/25/2015    History of Present Illness Pt is a 58 male who suffered a mechanical fall 3-4 weeks ago who came to ED with HA on 11/25. Pt found to have L SDH. Pt s/p L craniotomy for evactuation of subdural hematoma on 11/25. Underwent craniectomy on 11/30.     Clinical Impression   Pt was independent prior to admission.  He presents with impaired language and cognition, R side weakness, impaired balance and decreased activity tolerance.  He was not able to conform to formal testing of vision and sensation of R UE, but able to follow commands for gross manual muscle testing. Pt noted to use R UE spontaneously and effectively as a gross assist during ADL, but not as lead extremity due to incoordination. Pt requires +2 assist for sit to stand and transfers. Son available during visit. Reports wanting to take pt to SNF near his home in [place], [place].      Follow Up Recommendations  SNF;Supervision/Assistance - 24 hour    Equipment Recommendations       Recommendations for Other Services       Precautions / Restrictions Precautions Precautions: Fall      Mobility Bed Mobility               General bed mobility comments: pt in chair  Transfers Overall transfer level: Needs assistance Equipment used: 2 person hand held assist Transfers: Sit to/from Stand;Stand Pivot Transfers Sit to Stand: Mod assist;+2 physical assistance Stand pivot transfers: Max assist;+2 physical assistance       General transfer comment: posterior lean, assist to rise and for balance    Balance Overall balance assessment: Needs assistance Sitting-balance support: Feet supported Sitting balance-Leahy Scale: Fair       Standing balance-Leahy Scale: Poor                              ADL Overall ADL's : Needs assistance/impaired Eating/Feeding: Sitting;Set  up;Supervision/ safety Eating/Feeding Details (indicate cue type and reason): pt alternating between use of R and L hand Grooming: Wash/dry hands;Wash/dry face;Oral care;Sitting;Minimal assistance Grooming Details (indicate cue type and reason): assisted to set up toothbrush and washcloths, alternated between use of L and R hand Upper Body Bathing: Sitting;Moderate assistance   Lower Body Bathing: Sit to/from stand;Maximal assistance   Upper Body Dressing : Moderate assistance;Sitting   Lower Body Dressing: Maximal assistance;Sit to/from stand Lower Body Dressing Details (indicate cue type and reason): Pt able to don and doff L sock using R UE to assist.   Toilet Transfer Details (indicate cue type and reason): Pt with rectal tube and foley.                 Vision Additional Comments: unable to conform to formal assessment due to cognition, locating items on tray table   Perception     Praxis Praxis Deficits: Initiation;Ideation;Ideomotor    Pertinent Vitals/Pain Pain Assessment: No/denies pain     Hand Dominance Right   Extremity/Trunk Assessment Upper Extremity Assessment Upper Extremity Assessment: RUE deficits/detail;LUE deficits/detail RUE Deficits / Details: 3+/5 shoulder, 4/5 elbow, 3+/5 gross grasp RUE Sensation:  (unable to accurately assess due to cognition) RUE Coordination: decreased fine motor;decreased gross motor LUE Deficits / Details: generalized weakness   Lower Extremity Assessment Lower Extremity Assessment: Defer to PT evaluation   Cervical / Trunk Assessment Cervical / Trunk  Assessment: Normal   Communication Communication Communication: Receptive difficulties;Expressive difficulties   Cognition Arousal/Alertness: Awake/alert Behavior During Therapy: Flat affect Overall Cognitive Status: Impaired/Different from baseline Area of Impairment: Attention;Memory;Following commands;Safety/judgement;Awareness;Problem solving   Current Attention  Level: Sustained Memory: Decreased short-term memory;Decreased recall of precautions Following Commands: Follows one step commands with increased time;Follows one step commands inconsistently Safety/Judgement: Decreased awareness of safety;Decreased awareness of deficits   Problem Solving: Slow processing;Decreased initiation;Difficulty sequencing;Requires verbal cues;Requires tactile cues General Comments: pt able to follow most one step directions with verbal cueing only.  pt unable to state his location, but when given choice of right and wrong answers, pt was able to pick out right answer without cueing.     General Comments       Exercises   Other Exercises Other Exercises: Reaching activities in sitting at edge of chair.   Shoulder Instructions      Home Living Family/patient expects to be discharged to:: Skilled nursing facility                                 Additional Comments: Pt lived alone prior to admission, functioned independently.      Prior Functioning/Environment Level of Independence: Independent             OT Diagnosis: Generalized weakness;Cognitive deficits;Disturbance of vision;Hemiplegia dominant side;Apraxia   OT Problem List: Decreased strength;Decreased range of motion;Decreased activity tolerance;Impaired balance (sitting and/or standing);Impaired vision/perception;Decreased coordination;Decreased cognition;Decreased safety awareness;Decreased knowledge of use of DME or AE;Decreased knowledge of precautions;Impaired sensation;Impaired tone;Impaired UE functional use   OT Treatment/Interventions: Self-care/ADL training;Neuromuscular education;DME and/or AE instruction;Manual therapy;Therapeutic activities;Cognitive remediation/compensation;Visual/perceptual remediation/compensation;Patient/family education;Balance training    OT Goals(Current goals can be found in the care plan section) Acute Rehab OT Goals Patient Stated Goal: get  stronger (son) OT Goal Formulation: Patient unable to participate in goal setting Time For Goal Achievement: 10/02/15 Potential to Achieve Goals: Good ADL Goals Pt Will Perform Eating: with supervision;sitting (with R hand) Pt Will Perform Grooming: with set-up;with supervision;sitting Pt Will Perform Upper Body Bathing: with min assist;sitting Pt Will Perform Lower Body Dressing: sit to/from stand;with mod assist Pt Will Transfer to Toilet: with mod assist;stand pivot transfer;bedside commode Pt Will Perform Toileting - Clothing Manipulation and hygiene: with mod assist;sit to/from stand Additional ADL Goal #1: Pt will follow commands in context of ADL with minimal cues. Additional ADL Goal #2: Pt will spontaneously use R UE as lead during ADL.  OT Frequency: Min 2X/week   Barriers to D/C: Decreased caregiver support          Co-evaluation              End of Session Equipment Utilized During Treatment: Gait belt  Activity Tolerance: Patient tolerated treatment well Patient left: in chair;with call bell/phone within reach;with family/visitor present   Time: 1432-1501 OT Time Calculation (min): 29 min Charges:  OT General Charges $OT Visit: 1 Procedure OT Evaluation $Initial OT Evaluation Tier I: 1 Procedure OT Treatments $Self Care/Home Management : 8-22 mins G-Codes:    Malka So 09/25/2015, 3:24 PM [phone]

## 2015-09-26 LAB — CBC WITH DIFFERENTIAL/PLATELET
Basophils Absolute: 0 10*3/uL (ref 0.0–0.1)
Basophils Relative: 0 %
Eosinophils Absolute: 0.1 10*3/uL (ref 0.0–0.7)
Eosinophils Relative: 2 %
HEMATOCRIT: 35.1 % — AB (ref 39.0–52.0)
Hemoglobin: 11.6 g/dL — ABNORMAL LOW (ref 13.0–17.0)
LYMPHS ABS: 0.8 10*3/uL (ref 0.7–4.0)
LYMPHS PCT: 17 %
MCH: 33 pg (ref 26.0–34.0)
MCHC: 33 g/dL (ref 30.0–36.0)
MCV: 100 fL (ref 78.0–100.0)
MONO ABS: 0.4 10*3/uL (ref 0.1–1.0)
MONOS PCT: 8 %
NEUTROS ABS: 3.3 10*3/uL (ref 1.7–7.7)
Neutrophils Relative %: 73 %
Platelets: 69 10*3/uL — ABNORMAL LOW (ref 150–400)
RBC: 3.51 MIL/uL — ABNORMAL LOW (ref 4.22–5.81)
RDW: 12.8 % (ref 11.5–15.5)
WBC: 4.5 10*3/uL (ref 4.0–10.5)

## 2015-09-26 LAB — GLUCOSE, CAPILLARY
GLUCOSE-CAPILLARY: 327 mg/dL — AB (ref 65–99)
GLUCOSE-CAPILLARY: 338 mg/dL — AB (ref 65–99)
GLUCOSE-CAPILLARY: 174 mg/dL — AB (ref 65–99)
GLUCOSE-CAPILLARY: 205 mg/dL — AB (ref 65–99)
Glucose-Capillary: 100 mg/dL — ABNORMAL HIGH (ref 65–99)

## 2015-09-26 MED ORDER — PHENYTOIN SODIUM 50 MG/ML IJ SOLN
100.0000 mg | Freq: Two times a day (BID) | INTRAMUSCULAR | Status: DC
  Administered 2015-09-26 – 2015-09-28 (×4): 100 mg via INTRAVENOUS
  Filled 2015-09-26 (×6): qty 2

## 2015-09-26 NOTE — Progress Notes (Signed)
Speech Language Pathology Treatment: Dysphagia;Cognitive-Linquistic  Patient Details Name: Benjamin Schroeder MRN: [id number] DOB: 1946/03/25 Today's Date: 09/26/2015 Time: [id number] SLP Time Calculation (min) (ACUTE ONLY): 13 min  Assessment / Plan / Recommendation Clinical Impression  Treatment focused on dysphagia, cognition/communication: patient alert and cooperative. Required moderate verbal, visual, and tactile cues today to limit rate/bolus size  for small single sips.  Patient tolerating thin liquids via cup sips without overt indication of aspiration. Max cues required to transfer pills - pt without awareness of presence.  Communicating basic needs with mod assist for word retrieval, word recognition from choice of three.  Output is fluent; limited spontaneity.  Will follow.                HPI HPI: Pt is a 59 male who suffered a mechanical fall 3-4 weeks ago who came to ED with HA on 11/25. Pt found to have L SDH. Pt s/p L craniotomy for evactuation of subdural hematoma on 11/25. He was taken back to the OR to redo his initial crani.  Post op he was initially extubated but had to be re-intubated due to deteriorating mental status.        SLP Plan  Continue with current plan of care     Recommendations  Diet recommendations: Dysphagia 1 (puree);Thin liquid Liquids provided via: Cup;No straw Medication Administration: Whole meds with puree Supervision: Patient able to self feed;Full supervision/cueing for compensatory strategies Compensations: Slow rate;Small sips/bites Postural Changes and/or Swallow Maneuvers: Seated upright 90 degrees              Plan: Continue with current plan of care   Juan Quam Laurice 09/26/2015, 10:41 AM

## 2015-09-26 NOTE — Progress Notes (Signed)
Physical Therapy Treatment Patient Details Name: Benjamin Schroeder MRN: [id number] DOB: May 16, 1946 Today's Date: 09/26/2015    History of Present Illness Pt is a 9 male who suffered a mechanical fall 3-4 weeks ago who came to ED with HA on 11/25. Pt found to have L SDH. Pt s/p L craniotomy for evactuation of subdural hematoma on 11/25. Underwent craniectomy on 11/30.      PT Comments    Pt progressing towards physical therapy goals. Session was limited due to rectal tube dislodging during transfer to chair, and need to clean pt up. It appears that pt was able to take more defined steps during transition to chair today, but continues to demonstrate a heavy posterior lean. Will continue to follow.   Follow Up Recommendations  SNF;Supervision/Assistance - 24 hour     Equipment Recommendations  None recommended by PT    Recommendations for Other Services       Precautions / Restrictions Precautions Precautions: Fall Precaution Comments: Rectal tube, catheter Restrictions Weight Bearing Restrictions: No    Mobility  Bed Mobility Overal bed mobility: Needs Assistance;+2 for physical assistance Bed Mobility: Supine to Sit Rolling: Mod assist Sidelying to sit: Mod assist;+2 for physical assistance;HOB elevated       General bed mobility comments: Pt required assist for all aspects of bed mobility. Able to follow simple commands for sequencing.  Transfers Overall transfer level: Needs assistance Equipment used: 2 person hand held assist Transfers: Sit to/from [facility] Sit to Stand: Mod assist;+2 physical assistance Stand pivot transfers: Max assist;+2 physical assistance       General transfer comment: Pt requires blocking of knees during transition to chair. Pt stood from chair to raised bed rail to hold on while therapist performed peri-care as rectal tube dislodged during transfer. He maintained static standing ~10 seconds before B knees buckled, requiring mod  assist to recover.   Ambulation/Gait                 Stairs            Wheelchair Mobility    Modified Rankin (Stroke Patients Only) Modified Rankin (Stroke Patients Only) Pre-Morbid Rankin Score: No symptoms Modified Rankin: Severe disability     Balance Overall balance assessment: Needs assistance Sitting-balance support: Feet supported;Bilateral upper extremity supported Sitting balance-Leahy Scale: Fair Sitting balance - Comments: posterior lean.  Needs cueing to attend to balance and correct.   Postural control: Posterior lean;Right lateral lean Standing balance support: Bilateral upper extremity supported;During functional activity Standing balance-Leahy Scale: Zero Standing balance comment: +2 assist required                    Cognition Arousal/Alertness: Awake/alert Behavior During Therapy: Flat affect Overall Cognitive Status: Impaired/Different from baseline Area of Impairment: Attention;Memory;Following commands;Safety/judgement;Awareness;Problem solving Orientation Level: Disoriented to;Situation;Time Current Attention Level: Sustained Memory: Decreased short-term memory;Decreased recall of precautions Following Commands: Follows one step commands with increased time;Follows one step commands inconsistently Safety/Judgement: Decreased awareness of safety;Decreased awareness of deficits Awareness: Intellectual Problem Solving: Slow processing;Decreased initiation;Difficulty sequencing;Requires verbal cues;Requires tactile cues General Comments: pt able to follow most one step directions with verbal cueing only.  pt unable to state his location, but when given choice of right and wrong answers, pt was able to pick out right answer without cueing.      Exercises General Exercises - Lower Extremity Long Arc Quad: 10 reps    General Comments        Pertinent Vitals/Pain Pain Assessment:  Faces Faces Pain Scale: No hurt Pain  Intervention(s): Limited activity within patient's tolerance;Monitored during session;Repositioned    Home Living Family/patient expects to be discharged to:: Skilled nursing facility               Additional Comments: Pt lived alone prior to admission, functioned independently.    Prior Function Level of Independence: Independent      Comments: per RN pt living alone and functioning indep   PT Goals (current goals can now be found in the care plan section) Acute Rehab PT Goals PT Goal Formulation: Patient unable to participate in goal setting Time For Goal Achievement: 09/29/15 Potential to Achieve Goals: Good Progress towards PT goals: Progressing toward goals    Frequency  Min 3X/week    PT Plan Current plan remains appropriate    Co-evaluation             End of Session Equipment Utilized During Treatment: Gait belt Activity Tolerance: Patient tolerated treatment well Patient left: in chair;with call bell/phone within reach;with chair alarm set     Time: 1311-1340 PT Time Calculation (min) (ACUTE ONLY): 29 min  Charges:  $Therapeutic Activity: 23-37 mins                    G Codes:      Rolinda Roan 10/09/2015, 1:59 PM   Rolinda Roan, PT, DPT Acute Rehabilitation Services Pager: [phone]

## 2015-09-26 NOTE — Progress Notes (Signed)
Pt family kept calling RN requesting for MD to call them back for updates. Sticky note left for MD and Dr. Ronnald Ramp on unit notified. Francis Gaines Greydon Betke RN.

## 2015-09-26 NOTE — Progress Notes (Signed)
Patient ID: Benjamin Schroeder, male   DOB: [birth year], 69 y.o.   MRN: [id number] Subjective: Patient reports  No headache  Objective: Vital signs in last 24 hours: Temp:  [97.9 F (36.6 C)-98.7 F (37.1 C)] 98.4 F (36.9 C) (12/08 0900) Pulse Rate:  [66-79] 79 (12/08 0900) Resp:  [15-18] 18 (12/08 0601) BP: (119-143)/(48-69) 135/63 mmHg (12/08 0900) SpO2:  [96 %-100 %] 100 % (12/08 0900)  Intake/Output from previous day: 12/07 0701 - 12/08 0700 In: 52 [P.O.:610] Out: 1625 [Urine:675; Stool:950] Intake/Output this shift:    Easily arousable in regards the examiner, follows commands with all 4 extremities, mild right hemiparesis, some disorientation  Lab Results: Lab Results  Component Value Date   WBC 4.5 09/26/2015   HGB 11.6* 09/26/2015   HCT 35.1* 09/26/2015   MCV 100.0 09/26/2015   PLT 69* 09/26/2015   Lab Results  Component Value Date   INR 1.20 09/18/2015   BMET Lab Results  Component Value Date   NA 149* 09/24/2015   K 3.6 09/24/2015   CL 115* 09/24/2015   CO2 25 09/24/2015   GLUCOSE 333* 09/24/2015   BUN 25* 09/24/2015   CREATININE 1.00 09/24/2015   CALCIUM 9.2 09/24/2015    Studies/Results: No results found.  Assessment/Plan: Stable, awaiting SNF vs rehab   LOS: 13 days    Anastasya Jewell S 09/26/2015, 10:47 AM

## 2015-09-26 NOTE — Progress Notes (Signed)
Inpatient Diabetes Program Recommendations  AACE/ADA: New Consensus Statement on Inpatient Glycemic Control (2015)  Target Ranges:  Prepandial:   less than 140 mg/dL      Peak postprandial:   less than 180 mg/dL (1-2 hours)      Critically ill patients:  140 - 180 mg/dL   Review of Glycemic Control Consider adding basal Lantus or Levemir 15 units. Thank you  Raoul Pitch BSN, RN,CDE Inpatient Diabetes Coordinator [phone] (team pager)

## 2015-09-27 LAB — CBC WITH DIFFERENTIAL/PLATELET
Basophils Absolute: 0 10*3/uL (ref 0.0–0.1)
Basophils Relative: 0 %
EOS PCT: 2 %
Eosinophils Absolute: 0.1 10*3/uL (ref 0.0–0.7)
HEMATOCRIT: 34.9 % — AB (ref 39.0–52.0)
Hemoglobin: 11.9 g/dL — ABNORMAL LOW (ref 13.0–17.0)
LYMPHS ABS: 0.8 10*3/uL (ref 0.7–4.0)
LYMPHS PCT: 15 %
MCH: 33.5 pg (ref 26.0–34.0)
MCHC: 34.1 g/dL (ref 30.0–36.0)
MCV: 98.3 fL (ref 78.0–100.0)
MONO ABS: 0.5 10*3/uL (ref 0.1–1.0)
MONOS PCT: 9 %
NEUTROS ABS: 4 10*3/uL (ref 1.7–7.7)
Neutrophils Relative %: 74 %
PLATELETS: 68 10*3/uL — AB (ref 150–400)
RBC: 3.55 MIL/uL — ABNORMAL LOW (ref 4.22–5.81)
RDW: 12.5 % (ref 11.5–15.5)
WBC: 5.4 10*3/uL (ref 4.0–10.5)

## 2015-09-27 LAB — GLUCOSE, CAPILLARY
GLUCOSE-CAPILLARY: 131 mg/dL — AB (ref 65–99)
GLUCOSE-CAPILLARY: 142 mg/dL — AB (ref 65–99)
Glucose-Capillary: 131 mg/dL — ABNORMAL HIGH (ref 65–99)
Glucose-Capillary: 131 mg/dL — ABNORMAL HIGH (ref 65–99)
Glucose-Capillary: 234 mg/dL — ABNORMAL HIGH (ref 65–99)
Glucose-Capillary: 239 mg/dL — ABNORMAL HIGH (ref 65–99)

## 2015-09-27 MED ORDER — WHITE PETROLATUM GEL: Status: DC | PRN

## 2015-09-27 NOTE — Progress Notes (Signed)
CM has spoken with patients son Marya Amsler twice on the phone about patients readiness for discharge. Marya Amsler would like to tour the facility that has offered a bed to his father [facility]). CM encouraged him to tour the facility and to [facility] which Marya Amsler has offered as an option but to do so ASAP. CM discussed with him that Dr Annette Stable feels Mr Dinse is ready for discharge and that Medicare will not continue to pay for the patients stay once his care can be performed outside the hospital setting. Marya Amsler agrees to tour the facilities tomorrow and to inform [facility] CSW of his decision in an effort to have patient discharged and placed tomorrow. CM will also continue to follow for discharge needs.

## 2015-09-27 NOTE — Progress Notes (Signed)
Overall stable. Continues to make progress. Still with significant right-sided weakness and some moderate aphasia. Wound clean and dry. No other problems.  Progressing reasonably well. Plan for SNIF unit placement once bed available.

## 2015-09-27 NOTE — Progress Notes (Signed)
Occupational Therapy Treatment Patient Details Name: Benjamin Schroeder MRN: [id number] DOB: [birth year] Today's Date: 09/27/2015    History of present illness Pt is a 8 male who suffered a mechanical fall 3-4 weeks ago who came to ED with HA on 11/25. Pt found to have L SDH. Pt s/p L craniotomy for evactuation of subdural hematoma on 11/25. Underwent craniectomy on 11/30.     OT comments  Pt. Able to complete light grooming tasks bed level.  Able to utilize use of R UE for functional use to bring to face and wash with min instructional and assistance for thoroughness.    Follow Up Recommendations  SNF;Supervision/Assistance - 24 hour    Equipment Recommendations       Recommendations for Other Services Rehab consult    Precautions / Restrictions Precautions Precautions: Fall Restrictions Weight Bearing Restrictions: No       Mobility Bed Mobility                  Transfers                      Balance                                   ADL Overall ADL's : Needs assistance/impaired     Grooming: Wash/dry hands;Wash/dry face;Minimal assistance                                 General ADL Comments: pt. able to complete light grooming tasks with min a for thoroughness and task completion      Vision                     Perception     Praxis      Cognition   Behavior During Therapy: Flat affect Overall Cognitive Status: Impaired/Different from baseline          Following Commands: Follows one step commands with increased time;Follows one step commands inconsistently            Extremity/Trunk Assessment               Exercises     Shoulder Instructions       General Comments      Pertinent Vitals/ Pain       Pain Assessment: No/denies pain  Home Living                                          Prior Functioning/Environment              Frequency Min 2X/week      Progress Toward Goals  OT Goals(current goals can now be found in the care plan section)  Progress towards OT goals: Progressing toward goals     Plan Discharge plan remains appropriate    Co-evaluation                 End of Session     Activity Tolerance Patient tolerated treatment well   Patient Left in bed;with call bell/phone within reach;with bed alarm set;Other (comment) (wrist restraints re-applied and secured on)   Nurse Communication          Time: [id number] OT Time Calculation (min): 10  min  Charges: OT General Charges $OT Visit: 1 Procedure OT Treatments $Self Care/Home Management : 8-22 mins  Janice Coffin, COTA/L 09/27/2015, 9:57 AM

## 2015-09-27 NOTE — Clinical Social Work Note (Signed)
Currently patient has one bed offer at [facility]. Pt's son, Marya Amsler stated he will need to  Discuss option with his brother and tour facility before making a final decision. Pt's son made it clear that he would likely NOT be able to visit facility today. CSW explained that patient is medically stable for discharge and encouraged pt's son to make a decision quickly as possible. Pt's son expressed understanding that patient is medically stable however stated he would like to hear back from other facilities as well.   CSW has already contacted ALL desired facilities and shared findings with pt's son.   CSW has notified RNCM.   CSW remains available as needed.   Glendon Axe, MSW, LCSWA [phone] 09/27/2015 1:05 PM

## 2015-09-27 NOTE — Progress Notes (Signed)
Speech Language Pathology Treatment: Dysphagia  Patient Details Name: Meki Sustaita MRN: [id number] DOB: [birth year] Today's Date: 09/27/2015 Time: [id number] SLP Time Calculation (min) (ACUTE ONLY): 18 min  Assessment / Plan / Recommendation Clinical Impression  Pt consumed puree, thin and solid consistencies with SLP utilizing minimal verbal cueing for impulsivity and volume of liquids, slow rate and small bites without overt s/s of aspiration; straw not utilized d/t impulsivity and potientially increasing aspiration risk at present; pt with prolonged mastication with solids, but improved overall.  Dysphagia 2 diet and thin liquids recommended at this time d/t progress made with swallowing function.  Continue current POC   HPI HPI: Pt is a 43 male who suffered a mechanical fall 3-4 weeks ago who came to ED with HA on 11/25. Pt found to have L SDH. Pt s/p L craniotomy for evactuation of subdural hematoma on 11/25. He was taken back to the OR to redo his initial crani.  Post op he was initially extubated but had to be re-intubated due to deteriorating mental status.        SLP Plan  Continue with current plan of care     Recommendations  Diet recommendations: Dysphagia 2 (fine chop);Thin liquid Liquids provided via: Cup;No straw Medication Administration: Whole meds with puree Supervision: Patient able to self feed;Full supervision/cueing for compensatory strategies Compensations: Slow rate;Small sips/bites Postural Changes and/or Swallow Maneuvers: Seated upright 90 degrees              Oral Care Recommendations: Oral care BID Follow up Recommendations: Inpatient Rehab Plan: Continue with current plan of care   ADAMS,PAT, M.S., CCC-SLP 09/27/2015, 3:15 PM

## 2015-09-27 NOTE — Clinical Social Work Note (Signed)
Currently patient does NOT have bed offers in [place] area. Clinical Social Worker has shared this information with patient's son, Marya Amsler. CSW to make new referrals to [place] and [place]' all of [place], [place] remains available as needed.   Glendon Axe, MSW, LCSWA [phone] 09/27/2015 11:48 AM

## 2015-09-28 LAB — CBC WITH DIFFERENTIAL/PLATELET
BASOS PCT: 0 %
Basophils Absolute: 0 10*3/uL (ref 0.0–0.1)
EOS ABS: 0.1 10*3/uL (ref 0.0–0.7)
EOS PCT: 1 %
HCT: 30.6 % — ABNORMAL LOW (ref 39.0–52.0)
HEMOGLOBIN: 10.6 g/dL — AB (ref 13.0–17.0)
Lymphocytes Relative: 16 %
Lymphs Abs: 0.8 10*3/uL (ref 0.7–4.0)
MCH: 33.4 pg (ref 26.0–34.0)
MCHC: 34.6 g/dL (ref 30.0–36.0)
MCV: 96.5 fL (ref 78.0–100.0)
MONOS PCT: 7 %
Monocytes Absolute: 0.4 10*3/uL (ref 0.1–1.0)
NEUTROS PCT: 76 %
Neutro Abs: 4 10*3/uL (ref 1.7–7.7)
PLATELETS: 68 10*3/uL — AB (ref 150–400)
RBC: 3.17 MIL/uL — ABNORMAL LOW (ref 4.22–5.81)
RDW: 12.4 % (ref 11.5–15.5)
WBC: 5.3 10*3/uL (ref 4.0–10.5)

## 2015-09-28 LAB — GLUCOSE, CAPILLARY
GLUCOSE-CAPILLARY: 103 mg/dL — AB (ref 65–99)
GLUCOSE-CAPILLARY: 131 mg/dL — AB (ref 65–99)
Glucose-Capillary: 139 mg/dL — ABNORMAL HIGH (ref 65–99)
Glucose-Capillary: 128 mg/dL — ABNORMAL HIGH (ref 65–99)

## 2015-09-28 MED ORDER — AMANTADINE HCL 50 MG/5ML PO SYRP: 100.0000 mg | ORAL_SOLUTION | Freq: Two times a day (BID) | ORAL | Status: AC

## 2015-09-28 MED ORDER — LEVETIRACETAM 500 MG PO TABS: 500.0000 mg | ORAL_TABLET | Freq: Two times a day (BID) | ORAL | Status: AC

## 2015-09-28 NOTE — Discharge Summary (Signed)
Physician Discharge Summary  Patient ID: Benjamin Schroeder MRN: [id number] DOB/AGE: 1946-02-11 69 y.o.  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 Patient underwent surgery for evacuation of hematoma. Postoperatively the patient declined with worsening right-sided weakness and speech difficulty. Patient subsequently returned to the operating room where revision craniotomy and evacuation of subdural hematoma was performed. Postoperatively patient has slowly improved. He is now speaking but still has some degree of nonfluent aphasia. He follows commands readily. He is disoriented to place but oriented to time and person. He has mild weakness in his right upper extremity. His wound is healing well. He is progressing with therapy. Plan is for transfer to skilled nursing facility for further convalescence.  Consults:   Significant Diagnostic Studies:   Treatments:   Discharge Exam: Blood pressure 141/51, pulse 81, temperature 98.4 F (36.9 C), temperature source Oral, resp. rate 18, height 6' (1.829 m), weight 76.3 kg (168 lb 3.4 oz), SpO2 100 %. The patient is awake and alert. He is oriented to person and time but not place. His speech is halting but intermittently he can answer simple questions. Cranial nerve function  reveals good vision and extraocular movements bilaterally. Facial movement and sensation normal bilaterally. Tongue protrudes to midline. Motor examination reveals 5 over 5 strength in left upper and left lower extremity. 4.5 strength in right upper and right lower extremity. Wound healing well.  Disposition: Final discharge disposition not confirmed     Medication List    TAKE these medications        amantadine 50 MG/5ML solution  Commonly known as:  SYMMETREL  10 mLs (100 mg total) by Per NG tube route 2 (two) times daily.     atenolol 50 MG tablet  Commonly known as:  TENORMIN  Take 25 mg by mouth daily.     B-12 IJ  Inject as directed every 30 (thirty) days.     cyclobenzaprine 10 MG tablet  Commonly known as:  FLEXERIL  Take 10 mg by mouth at bedtime.     ferrous sulfate 325 (65 FE) MG tablet  Take 325 mg by mouth daily with breakfast.     Fish Oil 1200 MG Caps  Take 1 capsule by mouth 2 (two) times daily.     glipiZIDE 10 MG tablet  Commonly known as:  GLUCOTROL  Take 10 mg by mouth 2 (two) times daily before a meal.     levETIRAcetam 500 MG tablet  Commonly known as:  KEPPRA  Take 1 tablet (500 mg total) by mouth 2 (two) times daily.     lisinopril 20 MG tablet  Commonly known as:  PRINIVIL,ZESTRIL  Take 20 mg by mouth daily.     loperamide 2 MG capsule  Commonly known as:  IMODIUM  Take by mouth as needed for diarrhea or loose stools.  Magnesium 500 MG Tabs  Take 1 tablet by mouth 2 (two) times daily.     pantoprazole 40 MG tablet  Commonly known as:  PROTONIX  Take 40 mg by mouth daily.     simvastatin 40 MG tablet  Commonly known as:  ZOCOR  Take 40 mg by mouth daily.           Follow-up Information    Follow up with JONES,DAVID S, MD. Schedule an appointment as soon as possible for a visit in 3 weeks.   Specialty:  Neurosurgery   Contact information:   [address] [place] [address] [phone]        Signed: Charlie Pitter 09/28/2015, 12:45 PM

## 2015-09-28 NOTE — Progress Notes (Signed)
Report called to [facility] patient is alert and no complaints of PTAR is transporting to facility in [place] and will be met there by patients sons Marya Amsler and [place], IV removed and staples, belongings were gathered up and sent with PTAR.

## 2016-03-23 DIAGNOSIS — G20 Parkinson's disease: Secondary | ICD-10-CM | POA: Insufficient documentation

## 2017-01-20 IMAGING — CT CT HEAD W/O CM
1 of 2 series · 13 of 30 positions shown, 17 images · non-contrast
Comparison: Prior study from 09/13/2015.

CLINICAL DATA: Follow-up exam status post craniotomy.

EXAM:
CT HEAD WITHOUT CONTRAST
TECHNIQUE: Contiguous axial images were obtained from the base of the skull
through the vertex without intravenous contrast.

[Series 2: head 5.0 h30s · axial · 0.50mm/px · z∈[-264,-109]mm · 13 of 37 slices shown, 17 images]
[im 3/37  brain]
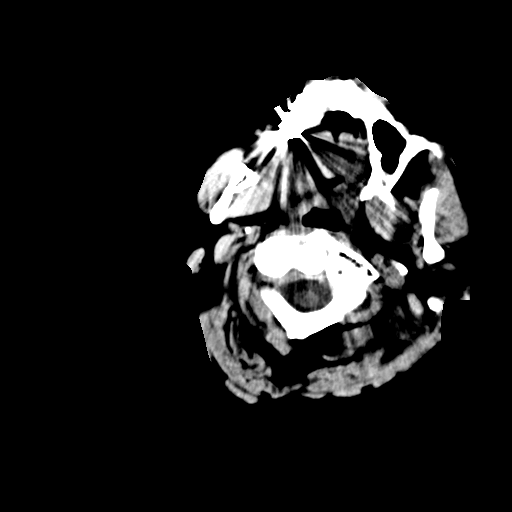
[im 3/37  bone]
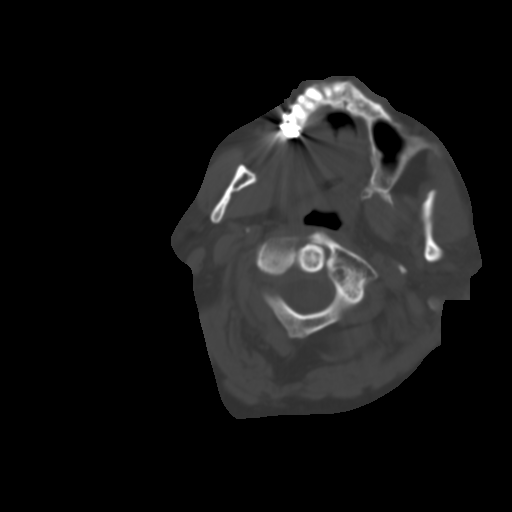
[im 6/37  brain]
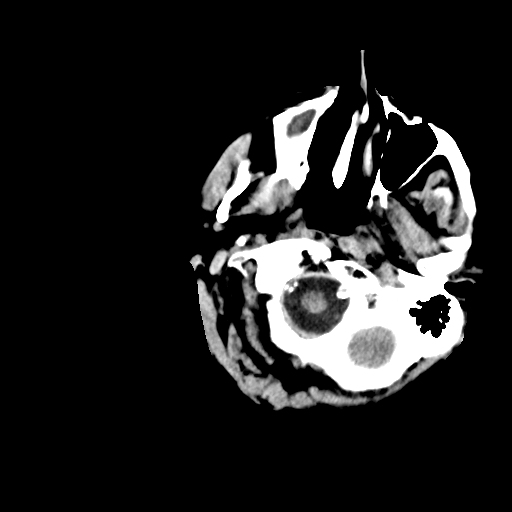
[im 8/37  brain]
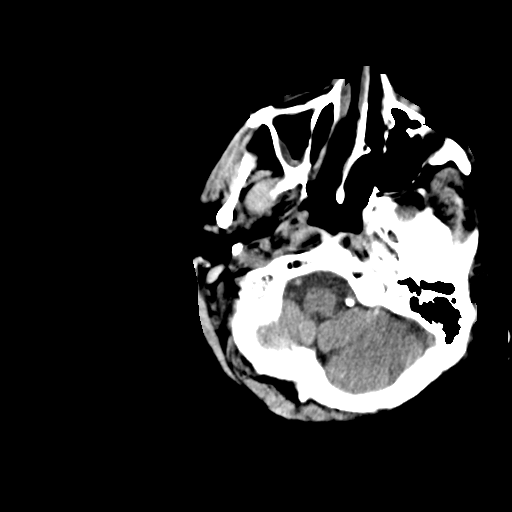
[im 11/37  brain]
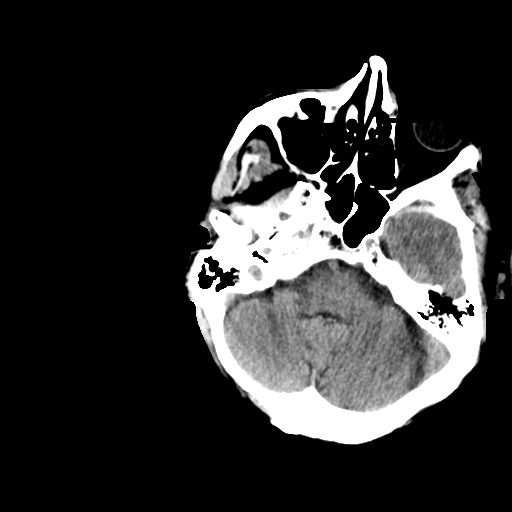
[im 13/37  brain]
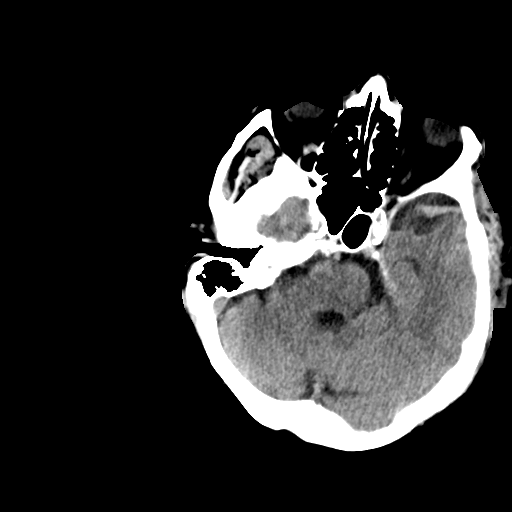
[im 13/37  bone]
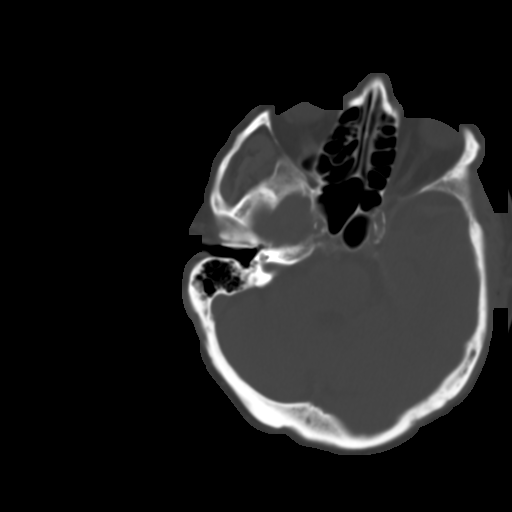
[im 16/37  brain]
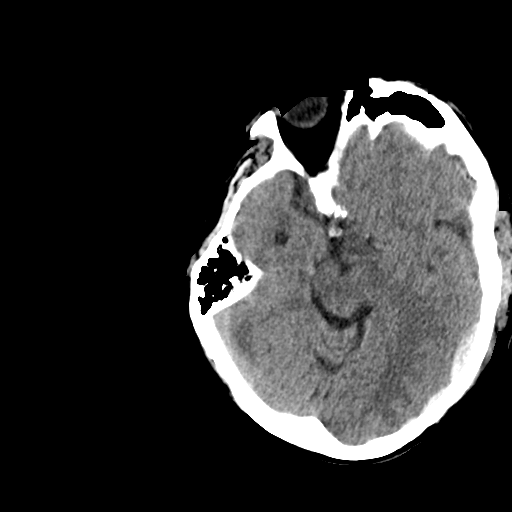
[im 19/37  brain]
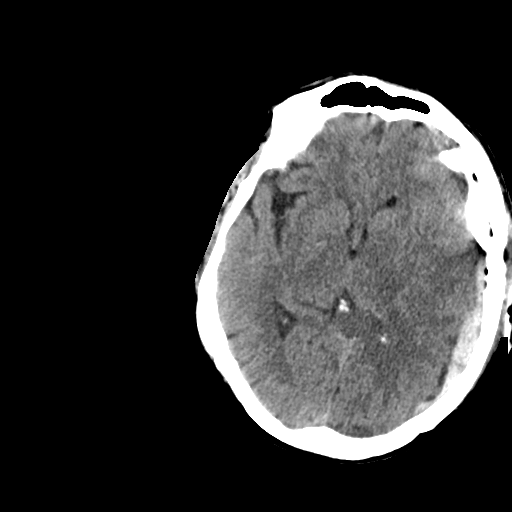
[im 21/37  brain]
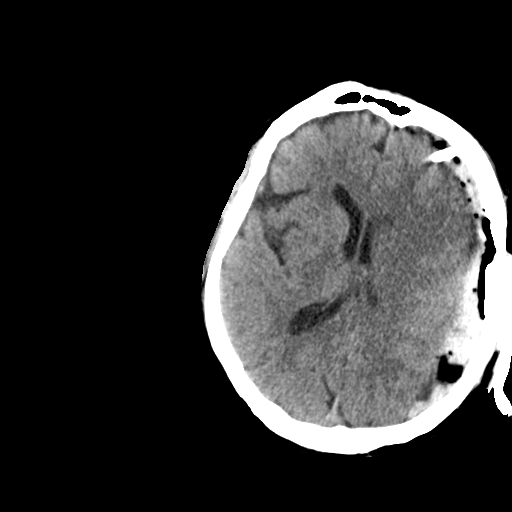
[im 24/37  brain]
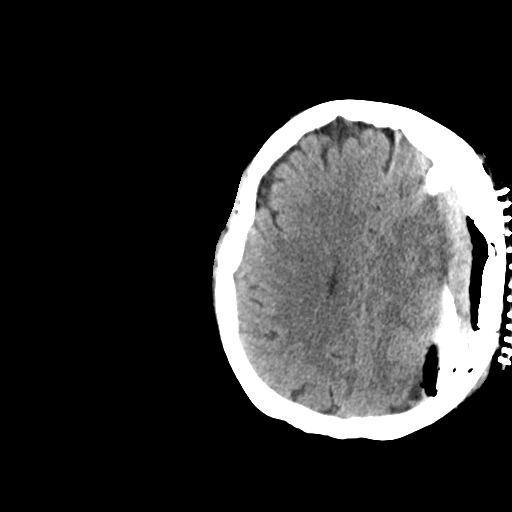
[im 24/37  bone]
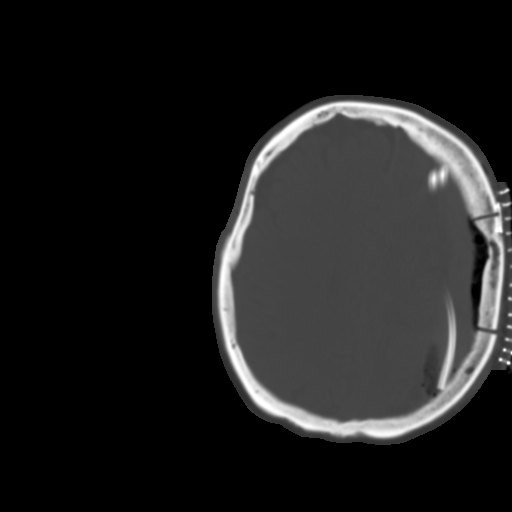
[im 26/37  brain]
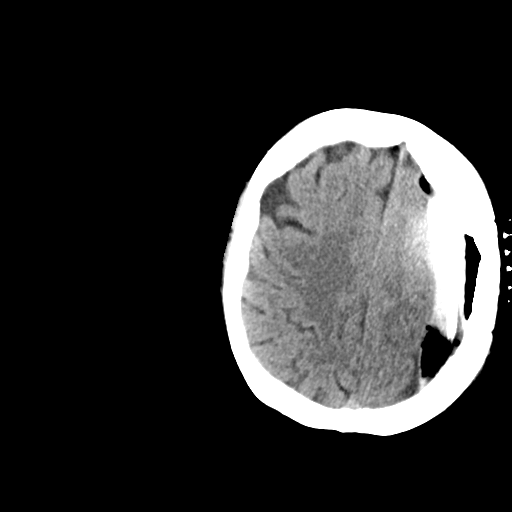
[im 29/37  brain]
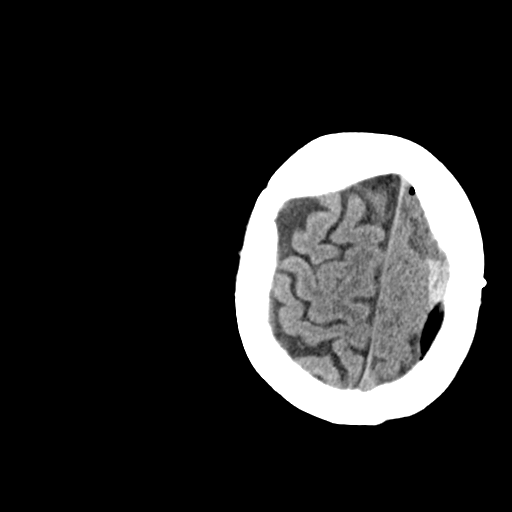
[im 31/37  brain]
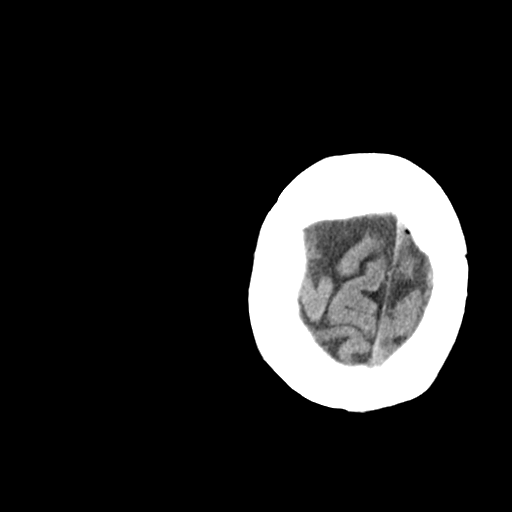
[im 34/37  brain]
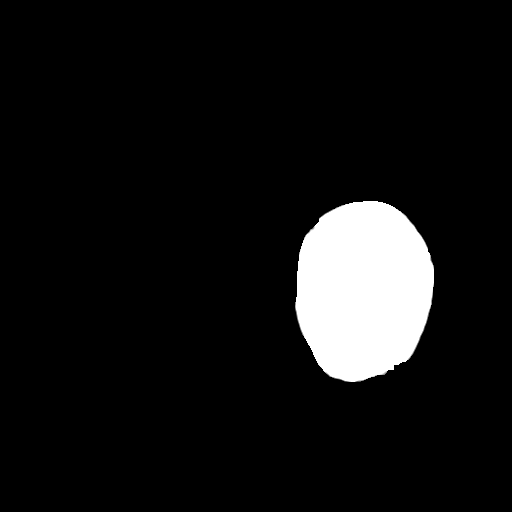
[im 34/37  bone]
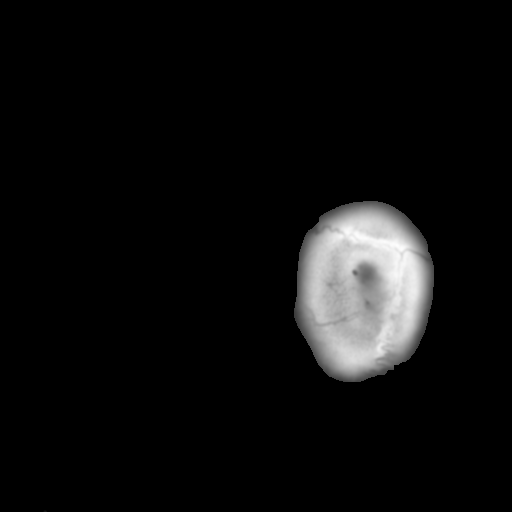

[13 of 30 positions shown; findings below may reference images not displayed]

FINDINGS: Postoperative changes from interval left frontoparietal craniotomy
seen for evacuation of a large left subdural hematoma. Skin staples
overlie the craniotomy bone flap. A subdural drain extending via a
burr hole is within the left subdural space. Previously seen mixed
attenuation left subdural hematoma has been largely evacuated,
although some hyperdense blood products persist. Overall, the
collection measures up to 18 mm. There is pneumocephalus within the
extra-axial space. There persistent mass effect on the subjacent
left cerebral hemisphere with sulcal effacement. Left-to-right shift
is improved now measuring 4 mm, previously 6 mm. Partial effacement
of the left lateral ventricle. No hydrocephalus. Mild basilar
cistern crowding with trace left uncal herniation.

No acute large vessel territory infarct.  No mass lesion.

No acute abnormality about the orbits.

Mucosal thickening within the right maxillary sinus. Paranasal
sinuses are otherwise clear. No mastoid effusion.
IMPRESSION: 1. Postoperative changes from interval left craniotomy for
evacuation of large left subdural hematoma. Left subdural drain
remains in place. No complication.
2. Decreased size of left subdural hematoma with improved
left-to-right shift, now measuring 4 mm.
